# Patient Record
Sex: Female | Born: 1950 | Race: White | Hispanic: No | Marital: Single | Smoking: Current every day smoker
Health system: Southern US, Community
[De-identification: ages and names within clinical notes are randomized; demographics above are authoritative.]

## PROBLEM LIST (undated history)

## (undated) DIAGNOSIS — J449 Chronic obstructive pulmonary disease, unspecified: Secondary | ICD-10-CM

## (undated) DIAGNOSIS — E7800 Pure hypercholesterolemia, unspecified: Secondary | ICD-10-CM

## (undated) DIAGNOSIS — I10 Essential (primary) hypertension: Secondary | ICD-10-CM

## (undated) HISTORY — PX: CATARACT EXTRACTION: SUR2

## (undated) HISTORY — DX: Chronic obstructive pulmonary disease, unspecified: J44.9

---

## 2014-07-21 DIAGNOSIS — J069 Acute upper respiratory infection, unspecified: Secondary | ICD-10-CM | POA: Diagnosis not present

## 2014-07-21 DIAGNOSIS — E782 Mixed hyperlipidemia: Secondary | ICD-10-CM | POA: Diagnosis not present

## 2014-07-21 DIAGNOSIS — I10 Essential (primary) hypertension: Secondary | ICD-10-CM | POA: Diagnosis not present

## 2014-07-21 DIAGNOSIS — E780 Pure hypercholesterolemia: Secondary | ICD-10-CM | POA: Diagnosis not present

## 2014-07-25 DIAGNOSIS — J209 Acute bronchitis, unspecified: Secondary | ICD-10-CM | POA: Diagnosis not present

## 2014-07-25 DIAGNOSIS — J9801 Acute bronchospasm: Secondary | ICD-10-CM | POA: Diagnosis not present

## 2015-01-26 DIAGNOSIS — I10 Essential (primary) hypertension: Secondary | ICD-10-CM | POA: Diagnosis not present

## 2015-01-26 DIAGNOSIS — R5383 Other fatigue: Secondary | ICD-10-CM | POA: Diagnosis not present

## 2015-01-26 DIAGNOSIS — E782 Mixed hyperlipidemia: Secondary | ICD-10-CM | POA: Diagnosis not present

## 2015-03-08 DIAGNOSIS — R799 Abnormal finding of blood chemistry, unspecified: Secondary | ICD-10-CM | POA: Diagnosis not present

## 2015-09-06 DIAGNOSIS — E782 Mixed hyperlipidemia: Secondary | ICD-10-CM | POA: Diagnosis not present

## 2015-09-06 DIAGNOSIS — J060 Acute laryngopharyngitis: Secondary | ICD-10-CM | POA: Diagnosis not present

## 2015-09-06 DIAGNOSIS — J069 Acute upper respiratory infection, unspecified: Secondary | ICD-10-CM | POA: Diagnosis not present

## 2015-09-06 DIAGNOSIS — I10 Essential (primary) hypertension: Secondary | ICD-10-CM | POA: Diagnosis not present

## 2015-12-11 DIAGNOSIS — J209 Acute bronchitis, unspecified: Secondary | ICD-10-CM | POA: Diagnosis not present

## 2015-12-11 DIAGNOSIS — J9801 Acute bronchospasm: Secondary | ICD-10-CM | POA: Diagnosis not present

## 2016-03-12 DIAGNOSIS — J060 Acute laryngopharyngitis: Secondary | ICD-10-CM | POA: Diagnosis not present

## 2016-03-12 DIAGNOSIS — E782 Mixed hyperlipidemia: Secondary | ICD-10-CM | POA: Diagnosis not present

## 2016-03-12 DIAGNOSIS — I10 Essential (primary) hypertension: Secondary | ICD-10-CM | POA: Diagnosis not present

## 2016-03-12 DIAGNOSIS — J069 Acute upper respiratory infection, unspecified: Secondary | ICD-10-CM | POA: Diagnosis not present

## 2016-09-11 DIAGNOSIS — I10 Essential (primary) hypertension: Secondary | ICD-10-CM | POA: Diagnosis not present

## 2016-09-11 DIAGNOSIS — J4520 Mild intermittent asthma, uncomplicated: Secondary | ICD-10-CM | POA: Diagnosis not present

## 2016-09-11 DIAGNOSIS — E782 Mixed hyperlipidemia: Secondary | ICD-10-CM | POA: Diagnosis not present

## 2017-02-04 ENCOUNTER — Encounter (HOSPITAL_COMMUNITY): Payer: Self-pay

## 2017-02-04 ENCOUNTER — Emergency Department (HOSPITAL_COMMUNITY): Payer: Medicare Other

## 2017-02-04 DIAGNOSIS — E860 Dehydration: Secondary | ICD-10-CM | POA: Diagnosis not present

## 2017-02-04 DIAGNOSIS — F17210 Nicotine dependence, cigarettes, uncomplicated: Secondary | ICD-10-CM | POA: Diagnosis not present

## 2017-02-04 DIAGNOSIS — I10 Essential (primary) hypertension: Secondary | ICD-10-CM | POA: Diagnosis not present

## 2017-02-04 DIAGNOSIS — E876 Hypokalemia: Secondary | ICD-10-CM | POA: Insufficient documentation

## 2017-02-04 DIAGNOSIS — R791 Abnormal coagulation profile: Secondary | ICD-10-CM | POA: Insufficient documentation

## 2017-02-04 DIAGNOSIS — Z23 Encounter for immunization: Secondary | ICD-10-CM | POA: Insufficient documentation

## 2017-02-04 DIAGNOSIS — E7800 Pure hypercholesterolemia, unspecified: Secondary | ICD-10-CM | POA: Insufficient documentation

## 2017-02-04 DIAGNOSIS — I959 Hypotension, unspecified: Secondary | ICD-10-CM | POA: Diagnosis not present

## 2017-02-04 DIAGNOSIS — J9801 Acute bronchospasm: Secondary | ICD-10-CM | POA: Diagnosis not present

## 2017-02-04 DIAGNOSIS — R0602 Shortness of breath: Secondary | ICD-10-CM | POA: Diagnosis not present

## 2017-02-04 DIAGNOSIS — Z79899 Other long term (current) drug therapy: Secondary | ICD-10-CM | POA: Insufficient documentation

## 2017-02-04 DIAGNOSIS — J208 Acute bronchitis due to other specified organisms: Secondary | ICD-10-CM | POA: Diagnosis not present

## 2017-02-04 DIAGNOSIS — R000 Tachycardia, unspecified: Secondary | ICD-10-CM | POA: Diagnosis not present

## 2017-02-04 DIAGNOSIS — R197 Diarrhea, unspecified: Secondary | ICD-10-CM | POA: Insufficient documentation

## 2017-02-04 DIAGNOSIS — I9589 Other hypotension: Secondary | ICD-10-CM | POA: Insufficient documentation

## 2017-02-04 DIAGNOSIS — R05 Cough: Secondary | ICD-10-CM | POA: Diagnosis not present

## 2017-02-04 DIAGNOSIS — J441 Chronic obstructive pulmonary disease with (acute) exacerbation: Secondary | ICD-10-CM | POA: Diagnosis not present

## 2017-02-04 DIAGNOSIS — N179 Acute kidney failure, unspecified: Secondary | ICD-10-CM | POA: Insufficient documentation

## 2017-02-04 DIAGNOSIS — R5383 Other fatigue: Secondary | ICD-10-CM | POA: Diagnosis not present

## 2017-02-04 DIAGNOSIS — J069 Acute upper respiratory infection, unspecified: Secondary | ICD-10-CM | POA: Diagnosis not present

## 2017-02-04 LAB — CBC
HCT: 37.3 % (ref 36.0–46.0)
HEMOGLOBIN: 12.8 g/dL (ref 12.0–15.0)
MCH: 34.6 pg — AB (ref 26.0–34.0)
MCHC: 34.3 g/dL (ref 30.0–36.0)
MCV: 100.8 fL — AB (ref 78.0–100.0)
Platelets: 231 10*3/uL (ref 150–400)
RBC: 3.7 MIL/uL — AB (ref 3.87–5.11)
RDW: 12.4 % (ref 11.5–15.5)
WBC: 13 10*3/uL — ABNORMAL HIGH (ref 4.0–10.5)

## 2017-02-04 LAB — D-DIMER, QUANTITATIVE: D-Dimer, Quant: 3.14 ug/mL-FEU — ABNORMAL HIGH (ref 0.00–0.50)

## 2017-02-04 LAB — BASIC METABOLIC PANEL
ANION GAP: 12 (ref 5–15)
BUN: 70 mg/dL — AB (ref 6–20)
CALCIUM: 9.2 mg/dL (ref 8.9–10.3)
CO2: 14 mmol/L — ABNORMAL LOW (ref 22–32)
Chloride: 109 mmol/L (ref 101–111)
Creatinine, Ser: 3.74 mg/dL — ABNORMAL HIGH (ref 0.44–1.00)
GFR calc Af Amer: 13 mL/min — ABNORMAL LOW (ref >60)
GFR, EST NON AFRICAN AMERICAN: 11 mL/min — AB (ref >60)
GLUCOSE: 109 mg/dL — AB (ref 65–99)
Potassium: 4 mmol/L (ref 3.5–5.1)
Sodium: 135 mmol/L (ref 135–145)

## 2017-02-04 LAB — I-STAT TROPONIN, ED: Troponin i, poc: 0.01 ng/mL (ref 0.00–0.08)

## 2017-02-04 NOTE — ED Triage Notes (Signed)
Pt sent here from [facility], [address] Gerre PebblesSally Levine.  Pt seen in office today for shortness of breath, fatigue.  Pt refused to go to ER at that time, MD did EKG- results normal, and lab work.  MD received call from lab that BNP was normal, D Dimer +, BUN 5, creatinine >3.  MD requesting VQ Scan to r/o PE.

## 2017-02-05 ENCOUNTER — Observation Stay (HOSPITAL_COMMUNITY)
Admission: EM | Admit: 2017-02-05 | Discharge: 2017-02-06 | Disposition: A | Discharge status: Home / Self Care | Payer: Medicare Other | Attending: Internal Medicine | Admitting: Internal Medicine

## 2017-02-05 ENCOUNTER — Emergency Department (HOSPITAL_COMMUNITY): Payer: Medicare Other

## 2017-02-05 DIAGNOSIS — Z720 Tobacco use: Secondary | ICD-10-CM

## 2017-02-05 DIAGNOSIS — J441 Chronic obstructive pulmonary disease with (acute) exacerbation: Secondary | ICD-10-CM | POA: Diagnosis not present

## 2017-02-05 DIAGNOSIS — R6510 Systemic inflammatory response syndrome (SIRS) of non-infectious origin without acute organ dysfunction: Secondary | ICD-10-CM | POA: Diagnosis present

## 2017-02-05 DIAGNOSIS — I959 Hypotension, unspecified: Secondary | ICD-10-CM

## 2017-02-05 DIAGNOSIS — R05 Cough: Secondary | ICD-10-CM

## 2017-02-05 DIAGNOSIS — R0602 Shortness of breath: Secondary | ICD-10-CM | POA: Diagnosis not present

## 2017-02-05 DIAGNOSIS — R197 Diarrhea, unspecified: Secondary | ICD-10-CM | POA: Diagnosis not present

## 2017-02-05 DIAGNOSIS — N179 Acute kidney failure, unspecified: Secondary | ICD-10-CM | POA: Diagnosis present

## 2017-02-05 DIAGNOSIS — A419 Sepsis, unspecified organism: Secondary | ICD-10-CM

## 2017-02-05 DIAGNOSIS — E860 Dehydration: Secondary | ICD-10-CM | POA: Diagnosis not present

## 2017-02-05 DIAGNOSIS — R000 Tachycardia, unspecified: Secondary | ICD-10-CM

## 2017-02-05 DIAGNOSIS — R059 Cough, unspecified: Secondary | ICD-10-CM

## 2017-02-05 HISTORY — DX: Essential (primary) hypertension: I10

## 2017-02-05 HISTORY — DX: Pure hypercholesterolemia, unspecified: E78.00

## 2017-02-05 LAB — URINALYSIS, ROUTINE W REFLEX MICROSCOPIC
Bilirubin Urine: NEGATIVE
Glucose, UA: NEGATIVE mg/dL
Hgb urine dipstick: NEGATIVE
KETONES UR: NEGATIVE mg/dL
Nitrite: NEGATIVE
PROTEIN: NEGATIVE mg/dL
Specific Gravity, Urine: 1.012 (ref 1.005–1.030)
pH: 5 (ref 5.0–8.0)

## 2017-02-05 LAB — I-STAT CG4 LACTIC ACID, ED: LACTIC ACID, VENOUS: 1.02 mmol/L (ref 0.5–1.9)

## 2017-02-05 LAB — HEPATIC FUNCTION PANEL
ALBUMIN: 3.6 g/dL (ref 3.5–5.0)
ALT: 22 U/L (ref 14–54)
AST: 25 U/L (ref 15–41)
Alkaline Phosphatase: 68 U/L (ref 38–126)
BILIRUBIN INDIRECT: 0.1 mg/dL — AB (ref 0.3–0.9)
Bilirubin, Direct: 0.2 mg/dL (ref 0.1–0.5)
TOTAL PROTEIN: 7.3 g/dL (ref 6.5–8.1)
Total Bilirubin: 0.3 mg/dL (ref 0.3–1.2)

## 2017-02-05 LAB — MRSA PCR SCREENING: MRSA BY PCR: NEGATIVE

## 2017-02-05 LAB — CREATININE, URINE, RANDOM: Creatinine, Urine: 153.44 mg/dL

## 2017-02-05 LAB — PROCALCITONIN: PROCALCITONIN: 0.14 ng/mL

## 2017-02-05 LAB — STREP PNEUMONIAE URINARY ANTIGEN: STREP PNEUMO URINARY ANTIGEN: NEGATIVE

## 2017-02-05 LAB — SODIUM, URINE, RANDOM: Sodium, Ur: 34 mmol/L

## 2017-02-05 LAB — HIV ANTIBODY (ROUTINE TESTING W REFLEX): HIV Screen 4th Generation wRfx: NONREACTIVE

## 2017-02-05 LAB — CORTISOL: CORTISOL PLASMA: 12.4 ug/dL

## 2017-02-05 LAB — TSH: TSH: 1.969 u[IU]/mL (ref 0.350–4.500)

## 2017-02-05 MED ORDER — DEXTROSE 5 % IV SOLN
500.0000 mg | Freq: Once | INTRAVENOUS | Status: AC
  Administered 2017-02-05: 500 mg via INTRAVENOUS
  Filled 2017-02-05: qty 500

## 2017-02-05 MED ORDER — IPRATROPIUM-ALBUTEROL 0.5-2.5 (3) MG/3ML IN SOLN
3.0000 mL | RESPIRATORY_TRACT | Status: DC | PRN
  Administered 2017-02-05: 3 mL via RESPIRATORY_TRACT
  Filled 2017-02-05: qty 3

## 2017-02-05 MED ORDER — ACETAMINOPHEN 650 MG RE SUPP: 650.0000 mg | Freq: Four times a day (QID) | RECTAL | Status: DC | PRN

## 2017-02-05 MED ORDER — TECHNETIUM TC 99M DIETHYLENETRIAME-PENTAACETIC ACID
30.0000 | Freq: Once | INTRAVENOUS | Status: AC | PRN
  Administered 2017-02-05: 30 via INTRAVENOUS

## 2017-02-05 MED ORDER — GUAIFENESIN ER 600 MG PO TB12
600.0000 mg | ORAL_TABLET | Freq: Two times a day (BID) | ORAL | Status: DC
  Administered 2017-02-05 – 2017-02-06 (×3): 600 mg via ORAL
  Filled 2017-02-05 (×3): qty 1

## 2017-02-05 MED ORDER — LACTATED RINGERS IV SOLN
INTRAVENOUS | Status: DC
  Administered 2017-02-05 – 2017-02-06 (×2): via INTRAVENOUS

## 2017-02-05 MED ORDER — IPRATROPIUM-ALBUTEROL 0.5-2.5 (3) MG/3ML IN SOLN
3.0000 mL | Freq: Four times a day (QID) | RESPIRATORY_TRACT | Status: DC
  Administered 2017-02-05 – 2017-02-06 (×4): 3 mL via RESPIRATORY_TRACT
  Filled 2017-02-05 (×3): qty 3

## 2017-02-05 MED ORDER — SODIUM CHLORIDE 0.9 % IV BOLUS (SEPSIS)
500.0000 mL | Freq: Once | INTRAVENOUS | Status: AC
  Administered 2017-02-05: 500 mL via INTRAVENOUS

## 2017-02-05 MED ORDER — PREDNISONE 50 MG PO TABS
60.0000 mg | ORAL_TABLET | Freq: Every day | ORAL | Status: DC
  Administered 2017-02-05 – 2017-02-06 (×2): 60 mg via ORAL
  Filled 2017-02-05: qty 1
  Filled 2017-02-05: qty 3

## 2017-02-05 MED ORDER — AZITHROMYCIN 500 MG IV SOLR
500.0000 mg | INTRAVENOUS | Status: DC
  Filled 2017-02-05: qty 500

## 2017-02-05 MED ORDER — HEPARIN SODIUM (PORCINE) 5000 UNIT/ML IJ SOLN
5000.0000 [IU] | Freq: Three times a day (TID) | INTRAMUSCULAR | Status: DC
  Administered 2017-02-05 – 2017-02-06 (×2): 5000 [IU] via SUBCUTANEOUS
  Filled 2017-02-05 (×2): qty 1

## 2017-02-05 MED ORDER — SODIUM CHLORIDE 0.9 % IV SOLN: INTRAVENOUS | Status: DC

## 2017-02-05 MED ORDER — DEXTROSE 5 % IV SOLN
2.0000 g | INTRAVENOUS | Status: DC
  Filled 2017-02-05: qty 2

## 2017-02-05 MED ORDER — HEPARIN BOLUS VIA INFUSION
4000.0000 [IU] | Freq: Once | INTRAVENOUS | Status: DC
  Administered 2017-02-05: 4000 [IU] via INTRAVENOUS
  Filled 2017-02-05: qty 4000

## 2017-02-05 MED ORDER — ONDANSETRON HCL 4 MG/2ML IJ SOLN: 4.0000 mg | Freq: Four times a day (QID) | INTRAMUSCULAR | Status: DC | PRN

## 2017-02-05 MED ORDER — DEXTROSE 5 % IV SOLN: 1.0000 g | Freq: Once | INTRAVENOUS | Status: DC

## 2017-02-05 MED ORDER — TECHNETIUM TO 99M ALBUMIN AGGREGATED
4.0000 | Freq: Once | INTRAVENOUS | Status: AC | PRN
  Administered 2017-02-05: 4 via INTRAVENOUS

## 2017-02-05 MED ORDER — ACETAMINOPHEN 325 MG PO TABS: 650.0000 mg | ORAL_TABLET | Freq: Four times a day (QID) | ORAL | Status: DC | PRN

## 2017-02-05 MED ORDER — ONDANSETRON HCL 4 MG PO TABS: 4.0000 mg | ORAL_TABLET | Freq: Four times a day (QID) | ORAL | Status: DC | PRN

## 2017-02-05 MED ORDER — DEXTROSE 5 % IV SOLN
2.0000 g | Freq: Once | INTRAVENOUS | Status: AC
  Administered 2017-02-05: 2 g via INTRAVENOUS
  Filled 2017-02-05: qty 2

## 2017-02-05 MED ORDER — PNEUMOCOCCAL VAC POLYVALENT 25 MCG/0.5ML IJ INJ
0.5000 mL | INJECTION | INTRAMUSCULAR | Status: AC
  Administered 2017-02-06: 0.5 mL via INTRAMUSCULAR
  Filled 2017-02-05: qty 0.5

## 2017-02-05 MED ORDER — SODIUM CHLORIDE 0.9 % IV BOLUS (SEPSIS)
1000.0000 mL | Freq: Once | INTRAVENOUS | Status: AC
  Administered 2017-02-05: 1000 mL via INTRAVENOUS

## 2017-02-05 MED ORDER — PANTOPRAZOLE SODIUM 40 MG PO TBEC
40.0000 mg | DELAYED_RELEASE_TABLET | Freq: Every day | ORAL | Status: DC
  Administered 2017-02-05 – 2017-02-06 (×2): 40 mg via ORAL
  Filled 2017-02-05 (×2): qty 1

## 2017-02-05 MED ORDER — HEPARIN (PORCINE) IN NACL 100-0.45 UNIT/ML-% IJ SOLN
1300.0000 [IU]/h | INTRAMUSCULAR | Status: DC
  Administered 2017-02-05: 1300 [IU]/h via INTRAVENOUS
  Filled 2017-02-05: qty 250

## 2017-02-05 NOTE — ED Notes (Signed)
Pt transferred to Nuclear medicine.

## 2017-02-05 NOTE — H&P (Signed)
History and Physical    Courtney Levine ZOX:[id number] DOB: 1950/09/07 DOA: 02/05/2017  Referring MD/NP/PA: Dierdre ForthHannah Muthersbaugh, PA-C PCP: Patient, No Pcp Per  Patient coming from: home  Chief Complaint: SOB  HPI: Courtney PetrinCathy I Levine is a 66 y.o. female with medical history significant of HTN, HLD, and tobacco abuse; who presents with complaints of shortness of breath with productive cough over the last 5 days. Patient reports coughing up losts clear sputum. She was seen by her PCP yesterday morning and thought to have a upper respiratory infection for which she was given an antibiotic  and steroid in the office. The PCP also obtain lab work that resulted later that afternoon and the patient was instructed to come to the emergency department for further evaluation. Upon further desiccation and appears that the patient had been feeling very fatigued. Notes associated symptoms of poor oral intake, several days of diarrhea, wheezing, and lightheadedness. Denies having any fever, chills, abdominal pain, inability to void, chest pain, loss of consciousness, vomiting, recent travel/prolonged immobilization, use of estrogens, or history of bloodclots/bleeding disorder.  ED Course: On admission to the emergency department patient was seen to be afebrile pulse 93-118, respirations 16-34, blood pressure as low as 75/50, and O2 saturation maintained on room air. Blood pressures improved after 500 mL of normal saline IV fluids. Labs revealed WBC 13, CO2 14, BUN 70, and creatinine 3.74, d-dimer 3.14. A VQ scan was obtained showing a small single matched defect in the right lung field no low probability for pulmonary embolus. Patient was ordered to be started on heparin. TRH called to admit. Asked ED providerto order a lactic acid, urinalysis, with in/out cath to further evaluate acute renal failure and possible infection.  Review of Systems  Constitutional: Positive for malaise/fatigue. Negative for chills and fever.    Respiratory: Positive for cough, sputum production, shortness of breath and wheezing.   Cardiovascular: Negative for chest pain and leg swelling.  Gastrointestinal: Positive for diarrhea.  Genitourinary: Negative for frequency and urgency.  Musculoskeletal: Positive for joint pain. Negative for falls.  Skin: Negative for itching and rash.  Neurological: Positive for dizziness and weakness. Negative for loss of consciousness.    Past Medical History:  Diagnosis Date  . Hypercholesteremia   . Hypertension     History reviewed. No pertinent surgical history.   reports that she has been smoking Cigarettes.  She has been smoking about 0.75 packs per day. She has never used smokeless tobacco. She reports that she does not drink alcohol or use drugs.  No Known Allergies  History reviewed. No pertinent family history.  Prior to Admission medications   Medication Sig Start Date End Date Taking? Authorizing Provider  albuterol (PROVENTIL HFA;VENTOLIN HFA) 108 (90 Base) MCG/ACT inhaler Inhale 2 puffs into the lungs every 4 (four) hours as needed for wheezing or shortness of breath.   Yes [provider]  levofloxacin (LEVAQUIN) 500 MG tablet Take 500 mg by mouth daily. For ten days   Yes [provider]  lisinopril (PRINIVIL,ZESTRIL) 20 MG tablet Take 20 mg by mouth daily.   Yes [provider]  lovastatin (MEVACOR) 20 MG tablet Take 20 mg by mouth daily.   Yes [provider]  predniSONE (DELTASONE) 20 MG tablet Take 20-60 mg by mouth See admin instructions. TAPER 60mg  daily x3d 40mg  daily x3d 20mg  daily x3d    [provider]    Physical Exam:  Constitutional:Elderly female who appears to be acutely Vitals:   02/05/17  0245 02/05/17 0300 02/05/17 0415 02/05/17 0430  BP: (!) 81/58 96/61 (!) 88/69 (!) 106/50  Pulse: (!) 104 (!) 101  (!) 108  Resp: (!) 23 (!) 26  (!) 24  Temp:      TempSrc:      SpO2: 100% 98%  98%  Weight:      Height:        Eyes: PERRL, lids and conjunctivae normal ENMT: Mucous membranes are dry. Posterior pharynx clear of any exudate or lesions.Normal dentition.  Neck: normal, supple, no masses, no thyromegaly Respiratory: clear to auscultation bilaterally, no wheezing, no crackles. Normal respiratory effort. No accessory muscle use.  Cardiovascular: Regular rate and rhythm, no murmurs / rubs / gallops. No extremity edema. 2+ pedal pulses. No carotid bruits.  Abdomen: no tenderness, no masses palpated. No hepatosplenomegaly. Bowel sounds positive.  Musculoskeletal: no clubbing / cyanosis. No joint deformity upper and lower extremities. Good ROM, no contractures. Normal muscle tone.  Skin: Poor skin turgor. Neurologic: CN 2-12 grossly intact. Sensation intact, DTR normal. Strength 5/5 in all 4.  Psychiatric: Normal judgment and insight. Alert and oriented x 3. Normal mood.     Labs on Admission: I have personally reviewed following labs and imaging studies  CBC:  Recent Labs Lab 02/04/17 2044  WBC 13.0*  HGB 12.8  HCT 37.3  MCV 100.8*  PLT 231   Basic Metabolic Panel:  Recent Labs Lab 02/04/17 2044  NA 135  K 4.0  CL 109  CO2 14*  GLUCOSE 109*  BUN 70*  CREATININE 3.74*  CALCIUM 9.2   GFR: Estimated Creatinine Clearance: 17.3 mL/min (A) (by C-G formula based on SCr of 3.74 mg/dL (H)). Liver Function Tests: No results for input(s): AST, ALT, ALKPHOS, BILITOT, PROT, ALBUMIN in the last 168 hours. No results for input(s): LIPASE, AMYLASE in the last 168 hours. No results for input(s): AMMONIA in the last 168 hours. Coagulation Profile: No results for input(s): INR, PROTIME in the last 168 hours. Cardiac Enzymes: No results for input(s): CKTOTAL, CKMB, CKMBINDEX, TROPONINI in the last 168 hours. BNP (last 3 results) No results for input(s): PROBNP in the last 8760 hours. HbA1C: No results for input(s): HGBA1C in the last 72 hours. CBG: No results for input(s): GLUCAP in the  last 168 hours. Lipid Profile: No results for input(s): CHOL, HDL, LDLCALC, TRIG, CHOLHDL, LDLDIRECT in the last 72 hours. Thyroid Function Tests: No results for input(s): TSH, T4TOTAL, FREET4, T3FREE, THYROIDAB in the last 72 hours. Anemia Panel: No results for input(s): VITAMINB12, FOLATE, FERRITIN, TIBC, IRON, RETICCTPCT in the last 72 hours. Urine analysis: No results found for: COLORURINE, APPEARANCEUR, LABSPEC, PHURINE, GLUCOSEU, HGBUR, BILIRUBINUR, KETONESUR, PROTEINUR, UROBILINOGEN, NITRITE, LEUKOCYTESUR Sepsis Labs: No results found for this or any previous visit (from the past 240 hour(s)).   Radiological Exams on Admission: Dg Chest 2 View  Result Date: 02/04/2017 CLINICAL DATA:  Shortness of breath and cough for 1 week EXAM: CHEST  2 VIEW COMPARISON:  None. FINDINGS: The heart size and mediastinal contours are within normal limits. Both lungs are clear. The visualized skeletal structures are unremarkable. IMPRESSION: No active cardiopulmonary disease. Electronically Signed   By: Alcide Clever M.D.   On: 02/04/2017 21:21   Nm Pulmonary Vent And Perf (v/q Scan)  Result Date: 02/05/2017 CLINICAL DATA:  PE suspected, intermediate prob, positive D-dimer. Cough and shortness of breath. EXAM: NUCLEAR MEDICINE VENTILATION - PERFUSION LUNG SCAN TECHNIQUE: Ventilation images were obtained in multiple projections using inhaled aerosol Tc-60m DTPA. Perfusion  images were obtained in multiple projections after intravenous injection of Tc-63m MAA. RADIOPHARMACEUTICALS:  32.0 mCi Technetium-50m DTPA aerosol inhalation and 4.2 mCi Technetium-74m MAA IV COMPARISON:  Chest radiograph earlier this day. FINDINGS: Ventilation: Diffusely patchy ventilation imaging, particularly in the right mid lung. There is central deposition of radiotracer. Perfusion: Profusion imaging more homogeneous than ventilation. Small matched defect in the periphery of the right mid lung. No ventilation perfusion mismatches.  IMPRESSION: 1. Low probability for pulmonary embolus with a single matched ventilation perfusion defect in the right midlung, likely right lower lobe. 2. Diffusely heterogeneous ventilation imaging with central deposition of radiotracer in the trachea, suggesting airways disease/COPD. Electronically Signed   By: Rubye Oaks M.D.   On: 02/05/2017 04:44    EKG: Independently reviewed. Sinus tachycardia with low voltage  Assessment/Plan SIRS: Acute. Patient presents with tachycardiac, tachypneic, and hypotensive with blood pressure 75/50. No lactic acid was initially obtained, but WBC was elevated at 13. Patient also recently given steroids of note. Question possibility of underlying infection as cause of patient's symptoms. - Admit to stepdown bed - Follow-up lactic acid - Add on Procalcitionin - check blood cultures   COPD, Exacerbation with possible CAP vs. Viral URI vs Less likely right lobe pulmonary embolus. Patient history more consistent with possible COPD exacerbation. Patient without increased well's criteria although elevated d-dimer of 3.14. VQ scan was low probability for signs of a PE. Patient had been ordered to be started on heparin drip. - Discontinued Heparin gtt per pharmacy - Prednisone - Empiric antibiotics of ceftriaxone and azithromycin - Duonebs QID and prn SOB/Wheezing - Mucinex  Transient Hypotension : Resolving. Blood pressures initially as low as 75/50 with improvement with IV fluids. -  Bolus additional of NS IV fluids, then place on a rate of 123ml/hr  Acute renal failure 2/2 dehydration: Patient reports no previous history of kidney issues in the past. She reports having diarrhea and poor appetite over the last 5 days. Creatinine 3.14 with BUN 72 suggest prerenal cause of symptoms. - Follow-up urinalysis - check FeNa - Strict I and O's - Continue IV fluids as seen above  Diarrhea: Patient reports resolution of diarrhea. - continue to  monitor  Tobacco abuse: Patient declines ED of nicotine patch. - Counseled on the need for cessation of tobacco DVT prophylaxis: Heparin Code Status: Full Family Communication: Discussed plan of care with patient and family present at bedside  Disposition Plan: Likely discharge home in 2-3 days was medically stable Consults called: None Admission status: Inpatient  Clydie Braun MD [facility] Pager [phone]   If 7PM-7AM, please contact night-coverage [email or web address] Password [email or web address]  02/05/2017, 5:11 AM

## 2017-02-05 NOTE — Progress Notes (Signed)
ANTICOAGULATION CONSULT NOTE - Initial Consult  Pharmacy Consult for heparin Indication: possible pulmonary embolus  No Known Allergies  Patient Measurements: Height: 5\' 7"  (170.2 cm) Weight: 215 lb (97.5 kg) IBW/kg (Calculated) : 61.6 Heparin Dosing Weight: 85kg  Vital Signs: Temp: 97.6 F (36.4 C) (09/11 2020) Temp Source: Oral (09/11 2020) BP: 75/50 (09/12 0006) Pulse Rate: 104 (09/12 0006)  Labs:  Recent Labs  02/04/17 2044  HGB 12.8  HCT 37.3  PLT 231  CREATININE 3.74*    Estimated Creatinine Clearance: 17.3 mL/min (A) (by C-G formula based on SCr of 3.74 mg/dL (H)).   Medical History: Past Medical History:  Diagnosis Date  . Hypercholesteremia   . Hypertension     Assessment: 66yo female presented to PCP for SOB, office called pt w/ abnormal D-dimer and sent to ED, awaiting VQ scan for possible PE, to begin heparin.  Goal of Therapy:  Heparin level 0.3-0.7 units/ml Monitor platelets by anticoagulation protocol: Yes   Plan:  Will give heparin 4000 units IV bolus x1 followed by gtt at 1300 units/hr and monitor heparin levels and CBC.  Vernard GamblesVeronda Angelamarie Levine, PharmD, BCPS  02/05/2017,12:55 AM

## 2017-02-05 NOTE — ED Provider Notes (Signed)
Patient presented to the ER with shortness of breath. Has had a cough for a week. Shortness of breath worsened and she saw her PCP who did a D-Dimer which was elevated, sent to ER.  Face to face Exam: HEENT - PERRLA Lungs - tachypnea, CTAB Heart - tachycardia, no M/R/G Abd - S/NT/ND Neuro - alert, oriented x3  Plan: Patient to Take and tachycardic, hypotensive. She has a markedly elevated d-dimer and therefore there is concern for PE. She was, however, found to be in acute renal failure, hypotension and tachycardia might be secondary to dehydration and renal failure. Because of the elevated creatinine, she cannot have a CT angiography to rule out PE. She will need to be scheduled for VQ scan. Blood pressure is improving with IV fluids. Continue to hydrate. As her vital signs are improving with hydration, will hold off on heparinization at this time.   Gilda CreasePollina, Sydnee Lamour J, MD 02/05/17 [phone]

## 2017-02-05 NOTE — ED Notes (Signed)
Dr. Judd Lienelo notified on pt.'s elevated D- dimer result.

## 2017-02-05 NOTE — Progress Notes (Signed)
Pt IV was leaking so a new IV was started with no complications. Pt also MRSA positive. Contact precautions initiated. Will continue to monitor.

## 2017-02-05 NOTE — Progress Notes (Signed)
 @        PROGRESS NOTE                                                                                                                                                                                                             Patient Demographics:    Courtney Levine, is a 66 y.o. female, DOB - 1950/09/24, ZOX:[id number]  Admit date - 02/05/2017   Admitting Physician Clydie Braun, MD  Outpatient Primary MD for the patient is Patient, No Pcp Per  LOS - 0  Chief Complaint  Patient presents with  . Shortness of Breath  . Cough       Brief Narrative Courtney Levine is a 66 y.o. female with medical history significant of HTN, HLD, and tobacco abuse; who presents with complaints of shortness of breath with productive cough over the last 5 days. Patient reports coughing up losts clear sputum. She was seen by her PCP yesterday morning and thought to have a upper respiratory infection for which she was given an antibiotic  and steroid in the office. The PCP also obtain lab work that resulted later that afternoon and the patient was instructed to come to the emergency department for further evaluation, she was found to be profoundly dehydrated, hypotensive with acute renal failure.   Subjective:    Courtney Levine today has, No headache, No chest pain, No abdominal pain - No Nausea, No new weakness tingling or numbness, No Cough - SOB.    Assessment  & Plan :     1.SIRs due to Viral URI causing cough, diarrhea with profound dehydration and ARF. No clear signs of pneumonia, antibiotics given upon admission will be continued for 24 hours until cultures are negative, aggressive IV fluids, diarrhea seems to have resolved, monitor BMP and blood pressure closely. Check TSH and random cortisol.  2. Smoking. Counseled to quit.  3. Elevated d-dimer. Due to acute illness, VQ scan low probability. No clinical suspicion of PE.  4. COPD. At baseline no wheezing, taper off steroids quickly. Supportive  care for now.    Diet : Diet Heart Room service appropriate? Yes; Fluid consistency: Thin    Family Communication  :  sister  Code Status :  Full  Disposition Plan  :  TBD  Consults  :  None  Procedures  :   VQ - low probability  DVT Prophylaxis  :    Heparin    Lab Results  Component Value Date   PLT 231 02/04/2017  Inpatient Medications  Scheduled Meds: . guaiFENesin  600 mg Oral BID  . heparin subcutaneous  5,000 Units Subcutaneous Q8H  . ipratropium-albuterol  3 mL Nebulization QID  . pantoprazole  40 mg Oral Daily  . predniSONE  60 mg Oral Q breakfast   Continuous Infusions: . [START ON 02/06/2017] azithromycin    . [START ON 02/06/2017] cefTRIAXone (ROCEPHIN)  IV    . lactated ringers     PRN Meds:.acetaminophen **OR** acetaminophen, ipratropium-albuterol, ondansetron **OR** ondansetron (ZOFRAN) IV  Antibiotics  :    Anti-infectives    Start     Dose/Rate Route Frequency Ordered Stop   02/06/17 0800  azithromycin (ZITHROMAX) 500 mg in dextrose 5 % 250 mL IVPB     500 mg 250 mL/hr over 60 Minutes Intravenous Every 24 hours 02/05/17 0613     02/06/17 0800  cefTRIAXone (ROCEPHIN) 2 g in dextrose 5 % 50 mL IVPB     2 g 100 mL/hr over 30 Minutes Intravenous Every 24 hours 02/05/17 0621     02/05/17 0630  cefTRIAXone (ROCEPHIN) 2 g in dextrose 5 % 50 mL IVPB     2 g 100 mL/hr over 30 Minutes Intravenous  Once 02/05/17 0621 02/05/17 0755   02/05/17 0630  azithromycin (ZITHROMAX) 500 mg in dextrose 5 % 250 mL IVPB     500 mg 250 mL/hr over 60 Minutes Intravenous  Once 02/05/17 0622 02/05/17 0935   02/05/17 0615  cefTRIAXone (ROCEPHIN) 1 g in dextrose 5 % 50 mL IVPB  Status:  Discontinued     1 g 100 mL/hr over 30 Minutes Intravenous  Once 02/05/17 0613 02/05/17 0621         Objective:   Vitals:   02/05/17 0815 02/05/17 0840 02/05/17 0921 02/05/17 0930  BP: (!) 120/91 109/84 95/64 98/66   Pulse: (!) 105 (!) 105 98 98  Resp: (!) 29 (!) 33 17 (!) 26   Temp:      TempSrc:      SpO2: 99% 97% 98% 96%  Weight:      Height:        Wt Readings from Last 3 Encounters:  02/04/17 97.5 kg (215 lb)     Intake/Output Summary (Last 24 hours) at 02/05/17 1031 Last data filed at 02/05/17 0546  Gross per 24 hour  Intake             1500 ml  Output                0 ml  Net             1500 ml     Physical Exam  Awake Alert, Oriented X 3, No new F.N deficits, Normal affect [place].AT,PERRAL Supple Neck,No JVD, No cervical lymphadenopathy appriciated.  Symmetrical Chest wall movement, Good air movement bilaterally, CTAB RRR,No Gallops,Rubs or new Murmurs, No Parasternal Heave +ve B.Sounds, Abd Soft, No tenderness, No organomegaly appriciated, No rebound - guarding or rigidity. No Cyanosis, Clubbing or edema, No new Rash or bruise      Data Review:    CBC  Recent Labs Lab 02/04/17 2044  WBC 13.0*  HGB 12.8  HCT 37.3  PLT 231  MCV 100.8*  MCH 34.6*  MCHC 34.3  RDW 12.4    Chemistries   Recent Labs Lab 02/04/17 2044 02/05/17 0639  NA 135  --   K 4.0  --   CL 109  --   CO2 14*  --   GLUCOSE 109*  --  BUN 70*  --   CREATININE 3.74*  --   CALCIUM 9.2  --   AST  --  25  ALT  --  22  ALKPHOS  --  68  BILITOT  --  0.3   ------------------------------------------------------------------------------------------------------------------ No results for input(s): CHOL, HDL, LDLCALC, TRIG, CHOLHDL, LDLDIRECT in the last 72 hours.  No results found for: HGBA1C ------------------------------------------------------------------------------------------------------------------  Recent Labs  02/04/17 2044  TSH 1.969   ------------------------------------------------------------------------------------------------------------------ No results for input(s): VITAMINB12, FOLATE, FERRITIN, TIBC, IRON, RETICCTPCT in the last 72 hours.  Coagulation profile No results for input(s): INR, PROTIME in the last 168 hours.   Recent  Labs  02/04/17 2044  DDIMER 3.14*    Cardiac Enzymes No results for input(s): CKMB, TROPONINI, MYOGLOBIN in the last 168 hours.  Invalid input(s): CK ------------------------------------------------------------------------------------------------------------------ No results found for: BNP  Micro Results No results found for this or any previous visit (from the past 240 hour(s)).  Radiology Reports Dg Chest 2 View  Result Date: 02/04/2017 CLINICAL DATA:  Shortness of breath and cough for 1 week EXAM: CHEST  2 VIEW COMPARISON:  None. FINDINGS: The heart size and mediastinal contours are within normal limits. Both lungs are clear. The visualized skeletal structures are unremarkable. IMPRESSION: No active cardiopulmonary disease. Electronically Signed   By: Alcide CleverMark  Lukens M.D.   On: 02/04/2017 21:21   Nm Pulmonary Vent And Perf (v/q Scan)  Result Date: 02/05/2017 CLINICAL DATA:  PE suspected, intermediate prob, positive D-dimer. Cough and shortness of breath. EXAM: NUCLEAR MEDICINE VENTILATION - PERFUSION LUNG SCAN TECHNIQUE: Ventilation images were obtained in multiple projections using inhaled aerosol Tc-3636m DTPA. Perfusion images were obtained in multiple projections after intravenous injection of Tc-3436m MAA. RADIOPHARMACEUTICALS:  32.0 mCi Technetium-1436m DTPA aerosol inhalation and 4.2 mCi Technetium-2836m MAA IV COMPARISON:  Chest radiograph earlier this day. FINDINGS: Ventilation: Diffusely patchy ventilation imaging, particularly in the right mid lung. There is central deposition of radiotracer. Perfusion: Profusion imaging more homogeneous than ventilation. Small matched defect in the periphery of the right mid lung. No ventilation perfusion mismatches. IMPRESSION: 1. Low probability for pulmonary embolus with a single matched ventilation perfusion defect in the right midlung, likely right lower lobe. 2. Diffusely heterogeneous ventilation imaging with central deposition of radiotracer in the  trachea, suggesting airways disease/COPD. Electronically Signed   By: Rubye OaksMelanie  Ehinger M.D.   On: 02/05/2017 04:44    Time Spent in minutes  30   Susa RaringPrashant  M.D on 02/05/2017 at 10:31 AM  Between 7am to 7pm - Pager - [phone] ( page via [email or web address], text pages only, please mention full 10 digit call back number). After 7pm go to [email or web address] - password [email or web address]

## 2017-02-05 NOTE — ED Provider Notes (Signed)
[facility]-EMERGENCY DEPT Provider Note   CSN: [id number] Arrival date & time: 02/04/17  2013     History   Chief Complaint Chief Complaint  Patient presents with  . Shortness of Breath  . Cough    HPI Courtney Levine is a 66 y.o. female with a hx of HTN, high cholesterol, COPD (current smoker) presents to the Emergency Department complaining of gradual, persistent, progressively worsening SOB, fatigue and cough onset 6 days ago.  Pt reports she thought she had a URI and saw her PCP this morning.  She was given an antibiotic injection and prednisone in the office.  Blood work was drawn and pt was d/c with prednisone and antibiotics with presumed URI.  Pt reports her PCP called this afternoon reporting abnormal labs and the need to present to the ED.  Pt reports she has never had a blood clot, does not take estrogen, denies periods of immobilization, fracture or surgery. Patient reports no problems with her kidneys in the past. She reports severe fatigue over the last several days but denies fevers or chills, chest pain, abdominal pain, nausea, vomiting, diarrhea, weakness, dizziness, syncope.  Patient denies difficulty voiding, dysuria, hematuria, urinary urgency, urinary frequency.  The history is provided by medical records and the patient. No language interpreter was used.  Shortness of Breath  Associated symptoms include cough. Pertinent negatives include no fever, no headaches, no wheezing, no chest pain, no vomiting, no abdominal pain, no rash and no leg swelling.  Cough  Associated symptoms include shortness of breath. Pertinent negatives include no chest pain, no headaches and no wheezing.    Past Medical History:  Diagnosis Date  . Hypercholesteremia   . Hypertension     There are no active problems to display for this patient.   History reviewed. No pertinent surgical history.  OB History    No data available       Home Medications    Prior to Admission medications     Medication Sig Start Date End Date Taking? Authorizing Provider  albuterol (PROVENTIL HFA;VENTOLIN HFA) 108 (90 Base) MCG/ACT inhaler Inhale 2 puffs into the lungs every 4 (four) hours as needed for wheezing or shortness of breath.   Yes [provider]  levofloxacin (LEVAQUIN) 500 MG tablet Take 500 mg by mouth daily. For ten days   Yes [provider]  lisinopril (PRINIVIL,ZESTRIL) 20 MG tablet Take 20 mg by mouth daily.   Yes [provider]  lovastatin (MEVACOR) 20 MG tablet Take 20 mg by mouth daily.   Yes [provider]  predniSONE (DELTASONE) 20 MG tablet Take 20-60 mg by mouth See admin instructions. TAPER  daily x3d  daily x3d  daily x3d    [provider]    Family History History reviewed. No pertinent family history.  Social History Social History  Substance Use Topics  . Smoking status: Current Every Day Smoker    Packs/day: 0.75    Types: Cigarettes  . Smokeless tobacco: Never Used  . Alcohol use No     Allergies   Patient has no known allergies.   Review of Systems Review of Systems  Constitutional: Negative for appetite change, diaphoresis, fatigue, fever and unexpected weight change.  HENT: Negative for mouth sores.   Eyes: Negative for visual disturbance.  Respiratory: Positive for cough and shortness of breath. Negative for chest tightness and wheezing.   Cardiovascular: Negative for chest pain, palpitations and leg swelling.  Gastrointestinal: Negative for abdominal pain, constipation, diarrhea,  nausea and vomiting.  Endocrine: Negative for polydipsia, polyphagia and polyuria.  Genitourinary: Negative for dysuria, frequency, hematuria and urgency.  Musculoskeletal: Negative for back pain and neck stiffness.  Skin: Negative for rash.  Allergic/Immunologic: Negative for immunocompromised state.  Neurological: Negative for syncope, light-headedness and headaches.  Hematological: Does not  bruise/bleed easily.  Psychiatric/Behavioral: Negative for sleep disturbance. The patient is not nervous/anxious.   All other systems reviewed and are negative.    Physical Exam Updated Vital Signs BP (!) 75/50 (BP Location: Left Arm)   Pulse (!) 104   Temp 97.6 F (36.4 C) (Oral)   Resp 16   Ht  (1.702 m)   Wt 97.5 kg (215 lb)   SpO2 96%   BMI 33.67 kg/m   Physical Exam  Constitutional: She appears well-developed and well-nourished. No distress.  Awake, alert, nontoxic appearance  HENT:  Head: Normocephalic and atraumatic.  Mouth/Throat: Oropharynx is clear and moist. No oropharyngeal exudate.  Eyes: Conjunctivae are normal. No scleral icterus.  Neck: Normal range of motion. Neck supple.  Cardiovascular: Regular rhythm and intact distal pulses.  Tachycardia present.   Pulses:      Radial pulses are 1+ on the right side, and 1+ on the left side.  Pulmonary/Chest: Effort normal and breath sounds normal. No accessory muscle usage. Tachypnea noted. No respiratory distress. She has no decreased breath sounds. She has no wheezes. She has no rhonchi. She has no rales.  Equal chest expansion Clear and equal breath sounds with good tidal volume  Abdominal: Soft. Bowel sounds are normal. She exhibits no mass. There is no tenderness. There is no rigidity, no rebound, no guarding and no CVA tenderness.  Musculoskeletal: Normal range of motion. She exhibits no edema.  No peripheral edema No calf Tenderness  Neurological: She is alert.  Speech is clear and goal oriented Moves extremities without ataxia  Skin: Skin is warm and dry. She is not diaphoretic.  Psychiatric: She has a normal mood and affect.  Nursing note and vitals reviewed.    ED Treatments / Results  Labs (all labs ordered are listed, but only abnormal results are displayed) Labs Reviewed  BASIC METABOLIC PANEL - Abnormal; Notable for the following:       Result Value   CO2 14 (*)    Glucose, Bld 109 (*)     BUN 70 (*)    Creatinine, Ser 3.74 (*)    GFR calc non Af Amer 11 (*)    GFR calc Af Amer 13 (*)    All other components within normal limits  CBC - Abnormal; Notable for the following:    WBC 13.0 (*)    RBC 3.70 (*)    MCV 100.8 (*)    MCH 34.6 (*)    All other components within normal limits  D-DIMER, QUANTITATIVE (NOT AT [facility]) - Abnormal; Notable for the following:    D-Dimer, Quant 3.14 (*)    All other components within normal limits  CULTURE, BLOOD (ROUTINE X 2)  CULTURE, BLOOD (ROUTINE X 2)  HEPARIN LEVEL (UNFRACTIONATED)  URINALYSIS, ROUTINE W REFLEX MICROSCOPIC  TSH  I-STAT TROPONIN, ED  I-STAT CG4 LACTIC ACID, ED    EKG  EKG Interpretation  Date/Time:  Tuesday February 04 2017 20:40:20 EDT Ventricular Rate:  127 PR Interval:  164 QRS Duration: 62 QT Interval:  302 QTC Calculation: 438 R Axis:   62 Text Interpretation:  Sinus tachycardia Low voltage QRS Borderline ECG Confirmed by Gilda Crease [phone])  on 02/05/2017 12:56:38 AM       Radiology Dg Chest 2 View  Result Date: 02/04/2017 CLINICAL DATA:  Shortness of breath and cough for 1 week EXAM: CHEST  2 VIEW COMPARISON:  None. FINDINGS: The heart size and mediastinal contours are within normal limits. Both lungs are clear. The visualized skeletal structures are unremarkable. IMPRESSION: No active cardiopulmonary disease. Electronically Signed   By: Alcide CleverMark  Lukens M.D.   On: 02/04/2017 21:21   Nm Pulmonary Vent And Perf (v/q Scan)  Result Date: 02/05/2017 CLINICAL DATA:  PE suspected, intermediate prob, positive D-dimer. Cough and shortness of breath. EXAM: NUCLEAR MEDICINE VENTILATION - PERFUSION LUNG SCAN TECHNIQUE: Ventilation images were obtained in multiple projections using inhaled aerosol Tc-334m DTPA. Perfusion images were obtained in multiple projections after intravenous injection of Tc-374m MAA. RADIOPHARMACEUTICALS:  32.0 mCi Technetium-484m DTPA aerosol inhalation and 4.2 mCi Technetium-94m MAA  IV COMPARISON:  Chest radiograph earlier this day. FINDINGS: Ventilation: Diffusely patchy ventilation imaging, particularly in the right mid lung. There is central deposition of radiotracer. Perfusion: Profusion imaging more homogeneous than ventilation. Small matched defect in the periphery of the right mid lung. No ventilation perfusion mismatches. IMPRESSION: 1. Low probability for pulmonary embolus with a single matched ventilation perfusion defect in the right midlung, likely right lower lobe. 2. Diffusely heterogeneous ventilation imaging with central deposition of radiotracer in the trachea, suggesting airways disease/COPD. Electronically Signed   By: Rubye OaksMelanie  Ehinger M.D.   On: 02/05/2017 04:44    Procedures Procedures (including critical care time)  Medications Ordered in ED Medications  technetium TC 50M diethylenetriame-pentaacetic acid (DTPA) injection 30 millicurie (30 millicuries Intravenous Given 02/05/17 0305)  technetium albumin aggregated (MAA) injection solution 4 millicurie (4 millicuries Intravenous Contrast Given 02/05/17 0306)  sodium chloride 0.9 % bolus 500 mL (500 mLs Intravenous New Bag/Given 02/05/17 0424)  sodium chloride 0.9 % bolus 500 mL (500 mLs Intravenous New Bag/Given 02/05/17 0538)     Initial Impression / Assessment and Plan / ED Course  I have reviewed the triage vital signs and the nursing notes.  Pertinent labs & imaging results that were available during my care of the patient were reviewed by me and considered in my medical decision making (see chart for details).  Clinical Course as of Feb 06 544  Wed Feb 05, 2017  [id number] Discussed with Dr. Katrinka BlazingSmith of [facility].  Additional labs pending.  He will evaluate for admission.    [HM]    Clinical Course User Index [HM] Perl Folmar, Boyd KerbsHannah, PA-C    Patient presents emergency department with new acute renal failure, significant tachycardia, shortness of breath and elevated d-dimer. Concern for possible PE patient  is otherwise low risk. No hypoxia. She is hypotensive upon arrival to evaluation room. Fluids given Patient does not have a history of CHF. No pulmonary edema on chest x-ray and no peripheral edema. Patient without signs and symptoms of infection; doubt sepsis as the reason for her hypotension. She has been given fluids for her hypotension with some improvement.  She does have a mild leukocytosis at 13.0. Pulmonary ventilation/perfusion scan does not show a mismatch however patient was given heparin.  Pt admitted to stepdown unit for further treatment.  The patient was discussed with and seen by Dr. Blinda LeatherwoodPollina who agrees with the treatment plan.   Final Clinical Impressions(s) / ED Diagnoses   Final diagnoses:  Shortness of breath  Tachycardia  Hypotension, unspecified hypotension type  AKI (acute kidney injury) (HCC)  Cough    New  Prescriptions New Prescriptions   No medications on file     Milta Deiters 02/05/17 0548    Gilda Crease, MD 02/05/17 [phone]

## 2017-02-06 DIAGNOSIS — R6510 Systemic inflammatory response syndrome (SIRS) of non-infectious origin without acute organ dysfunction: Secondary | ICD-10-CM

## 2017-02-06 LAB — BASIC METABOLIC PANEL
Anion gap: 7 (ref 5–15)
BUN: 39 mg/dL — ABNORMAL HIGH (ref 6–20)
CALCIUM: 8.4 mg/dL — AB (ref 8.9–10.3)
CHLORIDE: 119 mmol/L — AB (ref 101–111)
CO2: 16 mmol/L — AB (ref 22–32)
Creatinine, Ser: 1.44 mg/dL — ABNORMAL HIGH (ref 0.44–1.00)
GFR calc Af Amer: 42 mL/min — ABNORMAL LOW (ref >60)
GFR calc non Af Amer: 36 mL/min — ABNORMAL LOW (ref >60)
Glucose, Bld: 97 mg/dL (ref 65–99)
POTASSIUM: 3.3 mmol/L — AB (ref 3.5–5.1)
Sodium: 142 mmol/L (ref 135–145)

## 2017-02-06 LAB — URINE CULTURE
Culture: NO GROWTH
Special Requests: NORMAL

## 2017-02-06 LAB — CBC
HEMATOCRIT: 30.7 % — AB (ref 36.0–46.0)
HEMOGLOBIN: 10.4 g/dL — AB (ref 12.0–15.0)
MCH: 34.3 pg — ABNORMAL HIGH (ref 26.0–34.0)
MCHC: 33.9 g/dL (ref 30.0–36.0)
MCV: 101.3 fL — ABNORMAL HIGH (ref 78.0–100.0)
Platelets: 162 10*3/uL (ref 150–400)
RBC: 3.03 MIL/uL — AB (ref 3.87–5.11)
RDW: 13 % (ref 11.5–15.5)
WBC: 10.3 10*3/uL (ref 4.0–10.5)

## 2017-02-06 MED ORDER — AZITHROMYCIN 500 MG PO TABS: 500.0000 mg | ORAL_TABLET | Freq: Every day | ORAL | 0 refills | Status: DC

## 2017-02-06 MED ORDER — AZITHROMYCIN 500 MG PO TABS
500.0000 mg | ORAL_TABLET | Freq: Every day | ORAL | Status: DC
  Administered 2017-02-06: 500 mg via ORAL
  Filled 2017-02-06: qty 1

## 2017-02-06 MED ORDER — FUROSEMIDE 40 MG PO TABS
40.0000 mg | ORAL_TABLET | Freq: Once | ORAL | Status: AC
  Administered 2017-02-06: 40 mg via ORAL
  Filled 2017-02-06: qty 1

## 2017-02-06 MED ORDER — POTASSIUM CHLORIDE CRYS ER 20 MEQ PO TBCR
20.0000 meq | EXTENDED_RELEASE_TABLET | Freq: Once | ORAL | Status: AC
  Administered 2017-02-06: 20 meq via ORAL
  Filled 2017-02-06: qty 1

## 2017-02-06 MED ORDER — POTASSIUM CHLORIDE CRYS ER 20 MEQ PO TBCR
40.0000 meq | EXTENDED_RELEASE_TABLET | Freq: Once | ORAL | Status: AC
  Administered 2017-02-06: 40 meq via ORAL
  Filled 2017-02-06: qty 2

## 2017-02-06 MED ORDER — PRAVASTATIN SODIUM 40 MG PO TABS: 20.0000 mg | ORAL_TABLET | Freq: Every day | ORAL | Status: DC

## 2017-02-06 NOTE — Care Management Obs Status (Signed)
MEDICARE OBSERVATION STATUS NOTIFICATION   Patient Details  Name: Courtney Levine MRN: [id number] Date of Birth: [birth year]   Medicare Observation Status Notification Given:  Yes    Cherylann ParrClaxton, Courtney Sobocinski S, RN 02/06/2017, 11:39 AM

## 2017-02-06 NOTE — Progress Notes (Signed)
Patient discharged by wheelchair with NT.   

## 2017-02-06 NOTE — Care Management Obs Status (Signed)
MEDICARE OBSERVATION STATUS NOTIFICATION   Patient Details  Name: Courtney Levine MRN: [id number] Date of Birth: January 24, 1951   Medicare Observation Status Notification Given:  Yes    Cherylann ParrClaxton, Sara Selvidge S, RN 02/06/2017, 11:38 AM

## 2017-02-06 NOTE — Progress Notes (Signed)
Discharge note. Patient and sister educated at bedside. RN educated on medications and when to take them, follow up appointments, smoking cessation, and when to call the MD. Patient was unreceptive to education on smoking cessation. PIV removed without complications.

## 2017-02-06 NOTE — Discharge Instructions (Signed)
Follow with Primary MD in 2-3 days   Get CBC, CMP, 2 view Chest X ray checked  by Primary MD in 2-3 days ( we routinely change or add medications that can affect your baseline labs and fluid status, therefore we recommend that you get the mentioned basic workup next visit with your PCP, your PCP may decide not to get them or add new tests based on their clinical decision)  Activity: As tolerated with Full fall precautions use walker/cane & assistance as needed  Disposition Home    Diet:    Heart Healthy    For Heart failure patients - Check your Weight same time everyday, if you gain over 2 pounds, or you develop in leg swelling, experience more shortness of breath or chest pain, call your Primary MD immediately. Follow Cardiac Low Salt Diet and 1.5 lit/day fluid restriction.  On your next visit with your primary care physician please Get Medicines reviewed and adjusted.  Please request your Prim.MD to go over all Hospital Tests and Procedure/Radiological results at the follow up, please get all Hospital records sent to your Prim MD by signing hospital release before you go home.  If you experience worsening of your admission symptoms, develop shortness of breath, life threatening emergency, suicidal or homicidal thoughts you must seek medical attention immediately by calling 911 or calling your MD immediately  if symptoms less severe.  You Must read complete instructions/literature along with all the possible adverse reactions/side effects for all the Medicines you take and that have been prescribed to you. Take any new Medicines after you have completely understood and accpet all the possible adverse reactions/side effects.   Do not drive, operate heavy machinery, perform activities at heights, swimming or participation in water activities or provide baby sitting services if your were admitted for syncope or siezures until you have seen by Primary MD or a Neurologist and advised to do so  again.  Do not drive when taking Pain medications.    Do not take more than prescribed Pain, Sleep and Anxiety Medications  Special Instructions: If you have smoked or chewed Tobacco  in the last 2 yrs please stop smoking, stop any regular Alcohol  and or any Recreational drug use.  Wear Seat belts while driving.   Please note  You were cared for by a hospitalist during your hospital stay. If you have any questions about your discharge medications or the care you received while you were in the hospital after you are discharged, you can call the unit and asked to speak with the hospitalist on call if the hospitalist that took care of you is not available. Once you are discharged, your primary care physician will handle any further medical issues. Please note that NO REFILLS for any discharge medications will be authorized once you are discharged, as it is imperative that you return to your primary care physician (or establish a relationship with a primary care physician if you do not have one) for your aftercare needs so that they can reassess your need for medications and monitor your lab values.

## 2017-02-06 NOTE — Care Management CC44 (Signed)
Condition Code 44 Documentation Completed  Patient Details  Name: Courtney Levine MRN: [id number] Date of Birth: 1951-01-21   Condition Code 44 given:  Yes Patient signature on Condition Code 44 notice:  Yes Documentation of 2 MD's agreement:  Yes Code 44 added to claim:  Yes    Cherylann ParrClaxton, Jadrian Bulman S, RN 02/06/2017, 11:39 AM

## 2017-02-06 NOTE — Discharge Summary (Signed)
Courtney Levine UJW:[id number] DOB: [birth year] DOA: 02/05/2017  PCP: Patient, No Pcp Per  Admit date: 02/05/2017  Discharge date: 02/06/2017  Admitted From: Home   Disposition:  Home   Recommendations for Outpatient Follow-up:   Follow up with PCP in 1-2 weeks  PCP Please obtain BMP/CBC, 2 view CXR in 1week,  (see Discharge instructions)   PCP Please follow up on the following pending results: Final Blood and Uribe culture results   Home Health: None   Equipment/Devices: None  Consultations: None Discharge Condition: Stable   CODE STATUS: Full   Diet Recommendation:  Heart Healthy    Chief Complaint  Patient presents with  . Shortness of Breath  . Cough     Brief history of present illness from the day of admission and additional interim summary     Courtney Levine a 66 y.o.femalewith medical history significant of HTN, HLD, and tobacco abuse; who presents with complaints of shortness of breath with productive cough over the last 5 days. Patient reports coughing up lostsclear sputum. She was seen by her PCP yesterday morning and thought to have a upper respiratory infection for which she was given an antibiotic and steroid in the office. The PCP also obtain lab work that resulted later that afternoon and the patient was instructed to come to the emergency department for further evaluation, she was found to be profoundly dehydrated, hypotensive with acute renal failure.                                                                 Hospital Course    1.SIRs due to Viral URI ( may have had an atypical infection but less likely) causing cough, diarrhea with profound Hypotension, dehydration and ARF. No clear signs of pneumonia, antibiotics given upon admission were continued for 24 hours, got aggressive IV  fluids, diarrhea self resolved,But pressure much better and renal function almost back to normal, she is now symptom free eager to go home, will be given 5 days of oral erythromycin to cover for atypical infection if that was the cause of her problems, hold her ACE inhibitor upon discharge, requested her to follow with PCP within a week to get her blood pressure and BMP rechecked. Sr. bedside, patient back to her baseline.  2. Smoking. Counseled to quit.  3. Elevated d-dimer. Due to acute illness, VQ scan low probability. No clinical suspicion of PE.  4. COPD. At baseline no wheezing, taper off steroids quickly. No acute issues.  5. Hypokalemia. Replaced.   Discharge diagnosis     Principal Problem:   SIRS (systemic inflammatory response syndrome) (HCC) Active Problems:   Transient hypotension   Tobacco abuse   Diarrhea   COPD with acute exacerbation (HCC)   ARF (acute renal failure) (HCC)   Dehydration  Discharge instructions    Discharge Instructions    Diet - low sodium heart healthy    Complete by:  As directed    Discharge instructions    Complete by:  As directed    Follow with Primary MD in 2-3 days   Get CBC, CMP, 2 view Chest X ray checked  by Primary MD in 2-3 days ( we routinely change or add medications that can affect your baseline labs and fluid status, therefore we recommend that you get the mentioned basic workup next visit with your PCP, your PCP may decide not to get them or add new tests based on their clinical decision)  Activity: As tolerated with Full fall precautions use walker/cane & assistance as needed  Disposition Home    Diet:    Heart Healthy    For Heart failure patients - Check your Weight same time everyday, if you gain over 2 pounds, or you develop in leg swelling, experience more shortness of breath or chest pain, call your Primary MD immediately. Follow Cardiac Low Salt Diet and 1.5 lit/day fluid restriction.  On your next visit  with your primary care physician please Get Medicines reviewed and adjusted.  Please request your Prim.MD to go over all Hospital Tests and Procedure/Radiological results at the follow up, please get all Hospital records sent to your Prim MD by signing hospital release before you go home.  If you experience worsening of your admission symptoms, develop shortness of breath, life threatening emergency, suicidal or homicidal thoughts you must seek medical attention immediately by calling 911 or calling your MD immediately  if symptoms less severe.  You Must read complete instructions/literature along with all the possible adverse reactions/side effects for all the Medicines you take and that have been prescribed to you. Take any new Medicines after you have completely understood and accpet all the possible adverse reactions/side effects.   Do not drive, operate heavy machinery, perform activities at heights, swimming or participation in water activities or provide baby sitting services if your were admitted for syncope or siezures until you have seen by Primary MD or a Neurologist and advised to do so again.  Do not drive when taking Pain medications.    Do not take more than prescribed Pain, Sleep and Anxiety Medications  Special Instructions: If you have smoked or chewed Tobacco  in the last 2 yrs please stop smoking, stop any regular Alcohol  and or any Recreational drug use.  Wear Seat belts while driving.   Please note  You were cared for by a hospitalist during your hospital stay. If you have any questions about your discharge medications or the care you received while you were in the hospital after you are discharged, you can call the unit and asked to speak with the hospitalist on call if the hospitalist that took care of you is not available. Once you are discharged, your primary care physician will handle any further medical issues. Please note that NO REFILLS for any discharge  medications will be authorized once you are discharged, as it is imperative that you return to your primary care physician (or establish a relationship with a primary care physician if you do not have one) for your aftercare needs so that they can reassess your need for medications and monitor your lab values.   Increase activity slowly    Complete by:  As directed       Discharge Medications   Allergies as of 02/06/2017   No  Known Allergies     Medication List    STOP taking these medications   levofloxacin 500 MG tablet Commonly known as:  LEVAQUIN   lisinopril 20 MG tablet Commonly known as:  PRINIVIL,ZESTRIL     TAKE these medications   albuterol 108 (90 Base) MCG/ACT inhaler Commonly known as:  PROVENTIL HFA;VENTOLIN HFA Inhale 2 puffs into the lungs every 4 (four) hours as needed for wheezing or shortness of breath.   azithromycin 500 MG tablet Commonly known as:  ZITHROMAX Take 1 tablet (500 mg total) by mouth daily.   lovastatin 20 MG tablet Commonly known as:  MEVACOR Take 20 mg by mouth daily.   predniSONE 20 MG tablet Commonly known as:  DELTASONE Take 20-60 mg by mouth See admin instructions. TAPER  daily x3d  daily x3d  daily x3d            Discharge Care Instructions        Start     Ordered   02/06/17 0000  azithromycin (ZITHROMAX) 500 MG tablet  Daily     02/06/17 0920   02/06/17 0000  Increase activity slowly     02/06/17 0920   02/06/17 0000  Diet - low sodium heart healthy     02/06/17 0920   02/06/17 0000  Discharge instructions    Comments:  Follow with Primary MD in 2-3 days   Get CBC, CMP, 2 view Chest X ray checked  by Primary MD in 2-3 days ( we routinely change or add medications that can affect your baseline labs and fluid status, therefore we recommend that you get the mentioned basic workup next visit with your PCP, your PCP may decide not to get them or add new tests based on their clinical decision)  Activity: As  tolerated with Full fall precautions use walker/cane & assistance as needed  Disposition Home    Diet:    Heart Healthy    For Heart failure patients - Check your Weight same time everyday, if you gain over 2 pounds, or you develop in leg swelling, experience more shortness of breath or chest pain, call your Primary MD immediately. Follow Cardiac Low Salt Diet and 1.5 lit/day fluid restriction.  On your next visit with your primary care physician please Get Medicines reviewed and adjusted.  Please request your Prim.MD to go over all Hospital Tests and Procedure/Radiological results at the follow up, please get all Hospital records sent to your Prim MD by signing hospital release before you go home.  If you experience worsening of your admission symptoms, develop shortness of breath, life threatening emergency, suicidal or homicidal thoughts you must seek medical attention immediately by calling 911 or calling your MD immediately  if symptoms less severe.  You Must read complete instructions/literature along with all the possible adverse reactions/side effects for all the Medicines you take and that have been prescribed to you. Take any new Medicines after you have completely understood and accpet all the possible adverse reactions/side effects.   Do not drive, operate heavy machinery, perform activities at heights, swimming or participation in water activities or provide baby sitting services if your were admitted for syncope or siezures until you have seen by Primary MD or a Neurologist and advised to do so again.  Do not drive when taking Pain medications.    Do not take more than prescribed Pain, Sleep and Anxiety Medications  Special Instructions: If you have smoked or chewed Tobacco  in the last 2 yrs  please stop smoking, stop any regular Alcohol  and or any Recreational drug use.  Wear Seat belts while driving.   Please note  You were cared for by a hospitalist during your  hospital stay. If you have any questions about your discharge medications or the care you received while you were in the hospital after you are discharged, you can call the unit and asked to speak with the hospitalist on call if the hospitalist that took care of you is not available. Once you are discharged, your primary care physician will handle any further medical issues. Please note that NO REFILLS for any discharge medications will be authorized once you are discharged, as it is imperative that you return to your primary care physician (or establish a relationship with a primary care physician if you do not have one) for your aftercare needs so that they can reassess your need for medications and monitor your lab values.   02/06/17 0920      Follow-up Information    PCP. Schedule an appointment as soon as possible for a visit in 1 week(s).   Why:  1 week or less          Major procedures and Radiology Reports - PLEASE review detailed and final reports thoroughly  -         Dg Chest 2 View  Result Date: 02/04/2017 CLINICAL DATA:  Shortness of breath and cough for 1 week EXAM: CHEST  2 VIEW COMPARISON:  None. FINDINGS: The heart size and mediastinal contours are within normal limits. Both lungs are clear. The visualized skeletal structures are unremarkable. IMPRESSION: No active cardiopulmonary disease. Electronically Signed   By: Alcide Clever M.D.   On: 02/04/2017 21:21   Nm Pulmonary Vent And Perf (v/q Scan)  Result Date: 02/05/2017 CLINICAL DATA:  PE suspected, intermediate prob, positive D-dimer. Cough and shortness of breath. EXAM: NUCLEAR MEDICINE VENTILATION - PERFUSION LUNG SCAN TECHNIQUE: Ventilation images were obtained in multiple projections using inhaled aerosol Tc-67m DTPA. Perfusion images were obtained in multiple projections after intravenous injection of Tc-40m MAA. RADIOPHARMACEUTICALS:  32.0 mCi Technetium-66m DTPA aerosol inhalation and 4.2 mCi Technetium-60m MAA IV  COMPARISON:  Chest radiograph earlier this day. FINDINGS: Ventilation: Diffusely patchy ventilation imaging, particularly in the right mid lung. There is central deposition of radiotracer. Perfusion: Profusion imaging more homogeneous than ventilation. Small matched defect in the periphery of the right mid lung. No ventilation perfusion mismatches. IMPRESSION: 1. Low probability for pulmonary embolus with a single matched ventilation perfusion defect in the right midlung, likely right lower lobe. 2. Diffusely heterogeneous ventilation imaging with central deposition of radiotracer in the trachea, suggesting airways disease/COPD. Electronically Signed   By: Rubye Oaks M.D.   On: 02/05/2017 04:44    Micro Results     Recent Results (from the past 240 hour(s))  Urine culture     Status: None   Collection Time: 02/05/17  6:04 AM  Result Value Ref Range Status   Specimen Description URINE, CLEAN CATCH  Final   Special Requests Normal  Final   Culture NO GROWTH  Final   Report Status 02/06/2017 FINAL  Final  MRSA PCR Screening     Status: None   Collection Time: 02/05/17  5:18 PM  Result Value Ref Range Status   MRSA by PCR NEGATIVE NEGATIVE Final    Comment:        The GeneXpert MRSA Assay (FDA approved for NASAL specimens only), is one component of a comprehensive  MRSA colonization surveillance program. It is not intended to diagnose MRSA infection nor to guide or monitor treatment for MRSA infections.     Today   Subjective    Courtney Levine today has no headache,no chest abdominal pain,no new weakness tingling or numbness, feels much better wants to go home today.     Objective   Blood pressure 108/70, pulse 74, temperature 97.9 F (36.6 C), temperature source Oral, resp. rate 16, height 5\' 7"  (1.702 m), weight 100.5 kg (221 lb 9 oz), SpO2 97 %.   Intake/Output Summary (Last 24 hours) at 02/06/17 0920 Last data filed at 02/06/17 0651  Gross per 24 hour  Intake           2123.33 ml  Output                0 ml  Net          2123.33 ml    Exam Awake Alert, Oriented x 3, No new F.N deficits, Normal affect Courtney Levine.AT,PERRAL Supple Neck,No JVD, No cervical lymphadenopathy appriciated.  Symmetrical Chest wall movement, Good air movement bilaterally, CTAB RRR,No Gallops,Rubs or new Murmurs, No Parasternal Heave +ve B.Sounds, Abd Soft, Non tender, No organomegaly appriciated, No rebound -guarding or rigidity. No Cyanosis, Clubbing or edema, No new Rash or bruise   Data Review   CBC w Diff: Lab Results  Component Value Date   WBC 10.3 02/06/2017   HGB 10.4 (L) 02/06/2017   HCT 30.7 (L) 02/06/2017   PLT 162 02/06/2017    CMP: Lab Results  Component Value Date   NA 142 02/06/2017   K 3.3 (L) 02/06/2017   CL 119 (H) 02/06/2017   CO2 16 (L) 02/06/2017   BUN 39 (H) 02/06/2017   CREATININE 1.44 (H) 02/06/2017   PROT 7.3 02/05/2017   ALBUMIN 3.6 02/05/2017   BILITOT 0.3 02/05/2017   ALKPHOS 68 02/05/2017   AST 25 02/05/2017   ALT 22 02/05/2017  .   Total Time in preparing paper work, data evaluation and todays exam - 35 minutes  Susa RaringPrashant Singh M.D on 02/06/2017 at 9:20 AM  [facility]   Office  [phone]

## 2017-02-06 NOTE — Progress Notes (Signed)
Pt is not MRSA positive. Microbiology called about the wrong pt being positive. I noticed the negative result in the chart and called microbiology to report the mixed results. Will continue to monitor.

## 2017-02-10 LAB — CULTURE, BLOOD (ROUTINE X 2)
CULTURE: NO GROWTH
Culture: NO GROWTH
Special Requests: ADEQUATE
Special Requests: ADEQUATE

## 2017-02-17 DIAGNOSIS — J208 Acute bronchitis due to other specified organisms: Secondary | ICD-10-CM | POA: Diagnosis not present

## 2017-02-17 DIAGNOSIS — Z23 Encounter for immunization: Secondary | ICD-10-CM | POA: Diagnosis not present

## 2017-02-17 DIAGNOSIS — J180 Bronchopneumonia, unspecified organism: Secondary | ICD-10-CM | POA: Diagnosis not present

## 2017-02-17 DIAGNOSIS — N178 Other acute kidney failure: Secondary | ICD-10-CM | POA: Diagnosis not present

## 2017-02-17 DIAGNOSIS — R799 Abnormal finding of blood chemistry, unspecified: Secondary | ICD-10-CM | POA: Diagnosis not present

## 2017-02-17 DIAGNOSIS — I10 Essential (primary) hypertension: Secondary | ICD-10-CM | POA: Diagnosis not present

## 2017-02-17 DIAGNOSIS — R05 Cough: Secondary | ICD-10-CM | POA: Diagnosis not present

## 2017-02-17 DIAGNOSIS — E782 Mixed hyperlipidemia: Secondary | ICD-10-CM | POA: Diagnosis not present

## 2017-02-17 DIAGNOSIS — R5383 Other fatigue: Secondary | ICD-10-CM | POA: Diagnosis not present

## 2017-02-17 DIAGNOSIS — R0602 Shortness of breath: Secondary | ICD-10-CM | POA: Diagnosis not present

## 2017-06-05 DIAGNOSIS — E782 Mixed hyperlipidemia: Secondary | ICD-10-CM | POA: Diagnosis not present

## 2017-06-05 DIAGNOSIS — I10 Essential (primary) hypertension: Secondary | ICD-10-CM | POA: Diagnosis not present

## 2017-06-05 DIAGNOSIS — R799 Abnormal finding of blood chemistry, unspecified: Secondary | ICD-10-CM | POA: Diagnosis not present

## 2017-06-05 DIAGNOSIS — J4520 Mild intermittent asthma, uncomplicated: Secondary | ICD-10-CM | POA: Diagnosis not present

## 2017-12-03 DIAGNOSIS — E782 Mixed hyperlipidemia: Secondary | ICD-10-CM | POA: Diagnosis not present

## 2017-12-03 DIAGNOSIS — J4520 Mild intermittent asthma, uncomplicated: Secondary | ICD-10-CM | POA: Diagnosis not present

## 2017-12-03 DIAGNOSIS — I10 Essential (primary) hypertension: Secondary | ICD-10-CM | POA: Diagnosis not present

## 2018-03-02 DIAGNOSIS — Z23 Encounter for immunization: Secondary | ICD-10-CM | POA: Diagnosis not present

## 2018-03-26 DIAGNOSIS — H25043 Posterior subcapsular polar age-related cataract, bilateral: Secondary | ICD-10-CM | POA: Diagnosis not present

## 2018-03-26 DIAGNOSIS — H2513 Age-related nuclear cataract, bilateral: Secondary | ICD-10-CM | POA: Diagnosis not present

## 2018-05-12 DIAGNOSIS — H2512 Age-related nuclear cataract, left eye: Secondary | ICD-10-CM | POA: Diagnosis not present

## 2018-06-09 DIAGNOSIS — I10 Essential (primary) hypertension: Secondary | ICD-10-CM | POA: Diagnosis not present

## 2018-06-09 DIAGNOSIS — Z79899 Other long term (current) drug therapy: Secondary | ICD-10-CM | POA: Diagnosis not present

## 2018-06-09 DIAGNOSIS — R69 Illness, unspecified: Secondary | ICD-10-CM | POA: Diagnosis not present

## 2018-06-09 DIAGNOSIS — H2512 Age-related nuclear cataract, left eye: Secondary | ICD-10-CM | POA: Diagnosis not present

## 2018-06-09 DIAGNOSIS — E785 Hyperlipidemia, unspecified: Secondary | ICD-10-CM | POA: Diagnosis not present

## 2018-06-09 DIAGNOSIS — H259 Unspecified age-related cataract: Secondary | ICD-10-CM | POA: Diagnosis not present

## 2018-06-09 DIAGNOSIS — M1990 Unspecified osteoarthritis, unspecified site: Secondary | ICD-10-CM | POA: Diagnosis not present

## 2018-07-21 DIAGNOSIS — E785 Hyperlipidemia, unspecified: Secondary | ICD-10-CM | POA: Diagnosis not present

## 2018-07-21 DIAGNOSIS — Z79899 Other long term (current) drug therapy: Secondary | ICD-10-CM | POA: Diagnosis not present

## 2018-07-21 DIAGNOSIS — M1990 Unspecified osteoarthritis, unspecified site: Secondary | ICD-10-CM | POA: Diagnosis not present

## 2018-07-21 DIAGNOSIS — H259 Unspecified age-related cataract: Secondary | ICD-10-CM | POA: Diagnosis not present

## 2018-07-21 DIAGNOSIS — I10 Essential (primary) hypertension: Secondary | ICD-10-CM | POA: Diagnosis not present

## 2018-07-21 DIAGNOSIS — H2511 Age-related nuclear cataract, right eye: Secondary | ICD-10-CM | POA: Diagnosis not present

## 2018-07-21 DIAGNOSIS — R69 Illness, unspecified: Secondary | ICD-10-CM | POA: Diagnosis not present

## 2018-07-22 DIAGNOSIS — E782 Mixed hyperlipidemia: Secondary | ICD-10-CM | POA: Diagnosis not present

## 2018-07-22 DIAGNOSIS — I10 Essential (primary) hypertension: Secondary | ICD-10-CM | POA: Diagnosis not present

## 2018-07-22 DIAGNOSIS — J4520 Mild intermittent asthma, uncomplicated: Secondary | ICD-10-CM | POA: Diagnosis not present

## 2018-09-28 DIAGNOSIS — J060 Acute laryngopharyngitis: Secondary | ICD-10-CM | POA: Diagnosis not present

## 2019-01-20 DIAGNOSIS — R69 Illness, unspecified: Secondary | ICD-10-CM | POA: Diagnosis not present

## 2019-01-20 DIAGNOSIS — J449 Chronic obstructive pulmonary disease, unspecified: Secondary | ICD-10-CM | POA: Diagnosis not present

## 2019-01-20 DIAGNOSIS — I10 Essential (primary) hypertension: Secondary | ICD-10-CM | POA: Diagnosis not present

## 2019-01-20 DIAGNOSIS — E782 Mixed hyperlipidemia: Secondary | ICD-10-CM | POA: Diagnosis not present

## 2019-01-20 DIAGNOSIS — R799 Abnormal finding of blood chemistry, unspecified: Secondary | ICD-10-CM | POA: Diagnosis not present

## 2019-02-24 DIAGNOSIS — Z23 Encounter for immunization: Secondary | ICD-10-CM | POA: Diagnosis not present

## 2019-05-01 IMAGING — NM NM PULMONARY VENT & PERF
13 series · 13 of 13 positions shown · non-contrast
Comparison: Chest radiograph earlier this day.

CLINICAL DATA: PE suspected, intermediate prob, positive D-dimer.
Cough and shortness of breath.

EXAM:
NUCLEAR MEDICINE VENTILATION - PERFUSION LUNG SCAN
TECHNIQUE: Ventilation images were obtained in multiple projections using
inhaled aerosol Nc-RRm DTPA. Perfusion images were obtained in
multiple projections after intravenous injection of Nc-RRm MAA.
RADIOPHARMACEUTICALS:  32.0 mCi Xechnetium-CCm DTPA aerosol
inhalation and 4.2 mCi Xechnetium-CCm MAA IV

[Series 1: ant/post vent · 4.14mm/px · 1 of 1 slices shown]
[im 1/1]
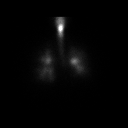

[Series 2: lao/rpo vent · 4.14mm/px · 1 of 1 slices shown (1 of 2)]
[im 1/1]
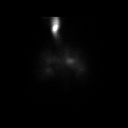

[Series 2: lao/rpo vent · 4.14mm/px · 1 of 1 slices shown (2 of 2)]
[im 1/1]
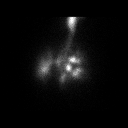

[Series 3: lpo/rao vent · 4.14mm/px · 1 of 1 slices shown (1 of 2)]
[im 1/1]
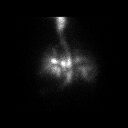

[Series 3: lpo/rao vent · 4.14mm/px · 1 of 1 slices shown (2 of 2)]
[im 1/1]
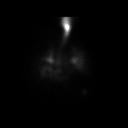

[Series 4: lt lat/rt lat vent · 4.14mm/px · 1 of 1 slices shown (1 of 2)]
[im 1/1]
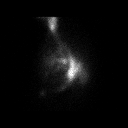

[Series 4: lt lat/rt lat vent · 4.14mm/px · 1 of 1 slices shown (2 of 2)]
[im 1/1]
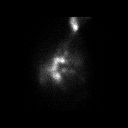

[Series 5: lpo/rao perf · 4.14mm/px · 1 of 1 slices shown (1 of 2)]
[im 1/1]
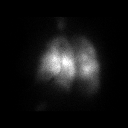

[Series 5: lpo/rao perf · 4.14mm/px · 1 of 1 slices shown (2 of 2)]
[im 1/1]
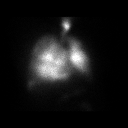

[Series 6: ant/post perf · 4.14mm/px · 1 of 1 slices shown (1 of 2)]
[im 1/1]
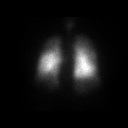

[Series 6: ant/post perf · 4.14mm/px · 1 of 1 slices shown (2 of 2)]
[im 1/1]
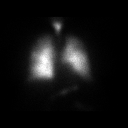

[Series 7: lao/rpo perf · 4.14mm/px · 1 of 1 slices shown (1 of 2)]
[im 1/1]
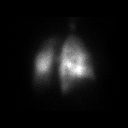

[Series 7: lao/rpo perf · 4.14mm/px · 1 of 1 slices shown (2 of 2)]
[im 1/1]
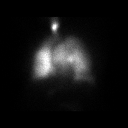

[13 of 13 positions shown; findings below may reference images not displayed]

FINDINGS: Ventilation: Diffusely patchy ventilation imaging, particularly in
the right mid lung. There is central deposition of radiotracer.

Perfusion: Profusion imaging more homogeneous than ventilation.
Small matched defect in the periphery of the right mid lung. No
ventilation perfusion mismatches.
IMPRESSION: 1. Low probability for pulmonary embolus with a single matched
ventilation perfusion defect in the right midlung, likely right
lower lobe.
2. Diffusely heterogeneous ventilation imaging with central
deposition of radiotracer in the trachea, suggesting airways
disease/COPD.

## 2019-05-13 DIAGNOSIS — H5316 Psychophysical visual disturbances: Secondary | ICD-10-CM | POA: Diagnosis not present

## 2019-07-22 ENCOUNTER — Other Ambulatory Visit: Payer: Self-pay

## 2019-07-22 ENCOUNTER — Encounter: Payer: Self-pay | Admitting: Physician Assistant

## 2019-07-22 ENCOUNTER — Ambulatory Visit (INDEPENDENT_AMBULATORY_CARE_PROVIDER_SITE_OTHER): Payer: Medicare HMO | Admitting: Physician Assistant

## 2019-07-22 VITALS — BP 124/80 | HR 71 | Temp 97.5°F | Resp 16 | Ht 66.0 in

## 2019-07-22 DIAGNOSIS — E782 Mixed hyperlipidemia: Secondary | ICD-10-CM | POA: Diagnosis not present

## 2019-07-22 DIAGNOSIS — I10 Essential (primary) hypertension: Secondary | ICD-10-CM | POA: Diagnosis not present

## 2019-07-22 DIAGNOSIS — J449 Chronic obstructive pulmonary disease, unspecified: Secondary | ICD-10-CM | POA: Insufficient documentation

## 2019-07-22 DIAGNOSIS — R799 Abnormal finding of blood chemistry, unspecified: Secondary | ICD-10-CM | POA: Diagnosis not present

## 2019-07-22 MED ORDER — LISINOPRIL 20 MG PO TABS: 20.0000 mg | ORAL_TABLET | Freq: Every day | ORAL | 1 refills | Status: DC

## 2019-07-22 NOTE — Assessment & Plan Note (Signed)
labwork pending Continue current meds as directed 

## 2019-07-22 NOTE — Progress Notes (Signed)
Established Patient Office Visit  Subjective:  Patient ID: Courtney Levine, female    DOB: 08/26/1950  Age: 69 y.o. MRN: [id number]  CC:  Chief Complaint  Patient presents with  . Follow-up  . Hyperlipidemia  . Hypertension    HPI Courtney Levine presents for follow up of COPD, hypertension and hyperlipidemia  Pt taking symbicort and albuterol as directed for COPD = has not had any recent flares of symptoms  Mixed hyperlipidemia  Pt presents with hyperlipidemia. . Compliance with treatment has been good; The patient is compliant with medications, maintains a low cholesterol diet , follows up as directed , and maintains an exercise regimen . The patient denies experiencing any hypercholesterolemia related symptoms.  Evidenced based information based on history , exam, and other sources has been used  for decision making. She is currently taking lovastatin 20mg  qd  Pt presents for follow up of hypertension. P The patient is tolerating the medication well without side effects. Compliance with treatment has been good; including taking medication as directed , maintains a healthy diet and regular exercise regimen , and following up as directed. Patient was evaluated using exam, physical, labs and other information to perform evidence based decision making.- pt currently taking lisinopril 20mg  qd  Past Medical History:  Diagnosis Date  . Hypercholesteremia   . Hypertension     Past Surgical History:  Procedure Laterality Date  . CATARACT EXTRACTION     2020    No family history on file.  Social History   Socioeconomic History  . Marital status: Single    Spouse name: Not on file  . Number of children: Not on file  . Years of education: Not on file  . Highest education level: Not on file  Occupational History  . Occupation: [employer]   part time  Tobacco Use  . Smoking status: Current Every Day Smoker    Packs/day: 0.75    Types: Cigarettes  . Smokeless  tobacco: Never Used  Substance and Sexual Activity  . Alcohol use: No  . Drug use: No  . Sexual activity: Not on file  Other Topics Concern  . Not on file  Social History Narrative  . Not on file   Social Determinants of Health   Financial Resource Strain:   . Difficulty of Paying Living Expenses: Not on file  Food Insecurity:   . Worried About in the Last Year: Not on file  . Ran Out of Food in the Last Year: Not on file  Transportation Needs:   . Lack of Transportation (Medical): Not on file  . Lack of Transportation (Non-Medical): Not on file  Physical Activity:   . Days of Exercise per Week: Not on file  . Minutes of Exercise per Session: Not on file  Stress:   . Feeling of Stress : Not on file  Social Connections:   . Frequency of Communication with Friends and Family: Not on file  . Frequency of Social Gatherings with Friends and Family: Not on file  . Attends Religious Services: Not on file  . Active Member of Clubs or Organizations: Not on file  . Attends 2021 Meetings: Not on file  . Marital Status: Not on file  Intimate Partner Violence:   . Fear of Current or Ex-Partner: Not on file  . Emotionally Abused: Not on file  . Physically Abused: Not on file  . Sexually Abused: Not on file  Current Outpatient Medications:  .  albuterol (PROVENTIL HFA;VENTOLIN HFA) 108 (90 Base) MCG/ACT inhaler, Inhale 2 puffs into the lungs every 4 (four) hours as needed for wheezing or shortness of breath., Disp: , Rfl:  .  lisinopril (ZESTRIL) 20 MG tablet, Take 1 tablet (20 mg total) by mouth daily., Disp: 90 tablet, Rfl: 1 .  lovastatin (MEVACOR) 20 MG tablet, Take 20 mg by mouth daily., Disp: , Rfl:  .  SYMBICORT 160-4.5 MCG/ACT inhaler, SMARTSIG:2 Puff(s) By Mouth Twice Daily, Disp: , Rfl:    No Known Allergies  ROS CONSTITUTIONAL: Negative for chills, fatigue, fever, unintentional weight gain and unintentional weight loss.  E/N/T:  Negative for ear pain, nasal congestion and sore throat.  CARDIOVASCULAR: Negative for chest pain, dizziness, palpitations and pedal edema.  RESPIRATORY: Negative for recent cough and dyspnea.  GASTROINTESTINAL: Negative for abdominal pain, acid reflux symptoms, constipation, diarrhea, nausea and vomiting.  MSK: Negative for arthralgias and myalgias.  INTEGUMENTARY: Negative for rash.  NEUROLOGICAL: Negative for dizziness and headaches.  PSYCHIATRIC: Negative for sleep disturbance and to question depression screen.  Negative for depression, negative for anhedonia.        Objective:    PHYSICAL EXAM:   VS: BP 124/80   Pulse 71   Temp (!) 97.5 F (36.4 C)   Resp 16   Ht 5\' 6"  (1.676 m)   SpO2 96%   BMI 35.76 kg/m   GEN: Well nourished, well developed, in no acute distress  HEENT: normal external ears and nose - normal external auditory canals and TMS - hearing grossly normal - normal nasal mucosa and septum - Lips, Teeth and Gums - normal  Oropharynx - normal mucosa, palate, and posterior pharynx Neck: no JVD or masses - no thyromegaly Cardiac: RRR; no murmurs, rubs, or gallops,no edema - no significant varicosities Respiratory:  normal respiratory rate and pattern with no distress - normal breath sounds with no rales, rhonchi, wheezes or rubs  Neuro:  Alert and Oriented x 3, Strength and sensation are intact - CN II-Xii grossly intact Psych: euthymic mood, appropriate affect and demeanor  BP 124/80   Pulse 71   Temp (!) 97.5 F (36.4 C)   Resp 16   Ht 5\' 6"  (1.676 m)   SpO2 96%   BMI 35.76 kg/m  Wt Readings from Last 3 Encounters:  02/05/17 221 lb 9 oz (100.5 kg)     Health Maintenance Due  Topic Date Due  . Hepatitis C Screening  1950-08-07  . TETANUS/TDAP  12/21/1967  . MAMMOGRAM  12/21/1998  . COLONOSCOPY  12/21/1998  . DEXA SCAN  12/20/2013  . PNA vac Low Risk Adult (2 of 2 - PCV13) 02/06/2018    There are no preventive care reminders to display for this  patient.  Lab Results  Component Value Date   TSH 1.969 02/04/2017   Lab Results  Component Value Date   WBC 10.3 02/06/2017   HGB 10.4 (L) 02/06/2017   HCT 30.7 (L) 02/06/2017   MCV 101.3 (H) 02/06/2017   PLT 162 02/06/2017   Lab Results  Component Value Date   NA 142 02/06/2017   K 3.3 (L) 02/06/2017   CO2 16 (L) 02/06/2017   GLUCOSE 97 02/06/2017   BUN 39 (H) 02/06/2017   CREATININE 1.44 (H) 02/06/2017   BILITOT 0.3 02/05/2017   ALKPHOS 68 02/05/2017   AST 25 02/05/2017   ALT 22 02/05/2017   PROT 7.3 02/05/2017   ALBUMIN 3.6 02/05/2017   CALCIUM  8.4 (L) 02/06/2017   ANIONGAP 7 02/06/2017   No results found for: CHOL No results found for: HDL No results found for: LDLCALC No results found for: TRIG No results found for: CHOLHDL No results found for: HGBA1C    Assessment & Plan:   Problem List Items Addressed This Visit      Cardiovascular and Mediastinum   Benign hypertension - Primary    labwork pending Continue current meds as directed      Relevant Medications   lisinopril (ZESTRIL) 20 MG tablet   Other Relevant Orders   CBC with Differential/Platelet   Comprehensive metabolic panel     Respiratory   Chronic obstructive pulmonary disease ([place])   Relevant Medications   SYMBICORT 160-4.5 MCG/ACT inhaler     Other   Mixed hyperlipidemia    labwork pending  Continue current meds as directed      Relevant Medications   lisinopril (ZESTRIL) 20 MG tablet   Other Relevant Orders   Lipid panel      Meds ordered this encounter  Medications  . lisinopril (ZESTRIL) 20 MG tablet    Sig: Take 1 tablet (20 mg total) by mouth daily.    Dispense:  90 tablet    Refill:  1    Order Specific Question:   Supervising Provider    AnswerShelton Silvas    Follow-up: Return in about 6 months (around 01/19/2020).    Courtney R Davin Archuletta, PA-C

## 2019-07-23 ENCOUNTER — Other Ambulatory Visit: Payer: Self-pay | Admitting: Physician Assistant

## 2019-07-23 DIAGNOSIS — R799 Abnormal finding of blood chemistry, unspecified: Secondary | ICD-10-CM

## 2019-07-23 LAB — COMPREHENSIVE METABOLIC PANEL
ALT: 23 IU/L (ref 0–32)
AST: 26 IU/L (ref 0–40)
Albumin/Globulin Ratio: 1.5 (ref 1.2–2.2)
Albumin: 4 g/dL (ref 3.8–4.8)
Alkaline Phosphatase: 99 IU/L (ref 39–117)
BUN/Creatinine Ratio: 10 — ABNORMAL LOW (ref 12–28)
BUN: 12 mg/dL (ref 8–27)
Bilirubin Total: 0.3 mg/dL (ref 0.0–1.2)
CO2: 18 mmol/L — ABNORMAL LOW (ref 20–29)
Calcium: 10 mg/dL (ref 8.7–10.3)
Chloride: 109 mmol/L — ABNORMAL HIGH (ref 96–106)
Creatinine, Ser: 1.16 mg/dL — ABNORMAL HIGH (ref 0.57–1.00)
GFR calc Af Amer: 55 mL/min/{1.73_m2} — ABNORMAL LOW (ref >59)
GFR calc non Af Amer: 48 mL/min/{1.73_m2} — ABNORMAL LOW (ref >59)
Globulin, Total: 2.6 g/dL (ref 1.5–4.5)
Glucose: 103 mg/dL — ABNORMAL HIGH (ref 65–99)
Potassium: 4.9 mmol/L (ref 3.5–5.2)
Sodium: 142 mmol/L (ref 134–144)
Total Protein: 6.6 g/dL (ref 6.0–8.5)

## 2019-07-23 LAB — LIPID PANEL
Chol/HDL Ratio: 3.6 ratio (ref 0.0–4.4)
Cholesterol, Total: 182 mg/dL (ref 100–199)
HDL: 51 mg/dL (ref >39)
LDL Chol Calc (NIH): 113 mg/dL — ABNORMAL HIGH (ref 0–99)
Triglycerides: 98 mg/dL (ref 0–149)
VLDL Cholesterol Cal: 18 mg/dL (ref 5–40)

## 2019-07-23 LAB — CARDIOVASCULAR RISK ASSESSMENT

## 2019-07-23 LAB — CBC WITH DIFFERENTIAL/PLATELET
Basophils Absolute: 0.1 10*3/uL (ref 0.0–0.2)
Basos: 1 %
EOS (ABSOLUTE): 0.3 10*3/uL (ref 0.0–0.4)
Eos: 4 %
Hematocrit: 36.7 % (ref 34.0–46.6)
Hemoglobin: 13 g/dL (ref 11.1–15.9)
Immature Grans (Abs): 0 10*3/uL (ref 0.0–0.1)
Immature Granulocytes: 0 %
Lymphocytes Absolute: 3.2 10*3/uL — ABNORMAL HIGH (ref 0.7–3.1)
Lymphs: 37 %
MCH: 36.7 pg — ABNORMAL HIGH (ref 26.6–33.0)
MCHC: 35.4 g/dL (ref 31.5–35.7)
MCV: 104 fL — ABNORMAL HIGH (ref 79–97)
Monocytes Absolute: 0.6 10*3/uL (ref 0.1–0.9)
Monocytes: 6 %
Neutrophils Absolute: 4.6 10*3/uL (ref 1.4–7.0)
Neutrophils: 52 %
Platelets: 192 10*3/uL (ref 150–450)
RBC: 3.54 x10E6/uL — ABNORMAL LOW (ref 3.77–5.28)
RDW: 12.7 % (ref 11.7–15.4)
WBC: 8.8 10*3/uL (ref 3.4–10.8)

## 2019-07-30 LAB — B12 AND FOLATE PANEL
Folate: >20 ng/mL (ref >3.0)
Vitamin B-12: 355 pg/mL (ref 232–1245)

## 2019-07-30 LAB — SPECIMEN STATUS REPORT

## 2019-07-30 LAB — TSH: TSH: 3.44 u[IU]/mL (ref 0.450–4.500)

## 2019-10-13 ENCOUNTER — Other Ambulatory Visit: Payer: Self-pay | Admitting: Physician Assistant

## 2020-01-19 ENCOUNTER — Ambulatory Visit: Payer: Medicare HMO | Admitting: Physician Assistant

## 2020-01-19 ENCOUNTER — Other Ambulatory Visit: Payer: Self-pay | Admitting: Physician Assistant

## 2020-01-19 MED ORDER — LOVASTATIN 20 MG PO TABS: 20.0000 mg | ORAL_TABLET | Freq: Every day | ORAL | 1 refills | Status: DC

## 2020-01-27 ENCOUNTER — Ambulatory Visit (INDEPENDENT_AMBULATORY_CARE_PROVIDER_SITE_OTHER): Payer: Medicare HMO | Admitting: Physician Assistant

## 2020-01-27 ENCOUNTER — Encounter: Payer: Self-pay | Admitting: Physician Assistant

## 2020-01-27 ENCOUNTER — Other Ambulatory Visit: Payer: Self-pay

## 2020-01-27 VITALS — BP 120/70 | HR 70 | Temp 97.4°F | Resp 17 | Ht 65.0 in | Wt 179.0 lb

## 2020-01-27 DIAGNOSIS — I10 Essential (primary) hypertension: Secondary | ICD-10-CM | POA: Diagnosis not present

## 2020-01-27 DIAGNOSIS — J060 Acute laryngopharyngitis: Secondary | ICD-10-CM | POA: Insufficient documentation

## 2020-01-27 DIAGNOSIS — E782 Mixed hyperlipidemia: Secondary | ICD-10-CM | POA: Diagnosis not present

## 2020-01-27 MED ORDER — AZITHROMYCIN 250 MG PO TABS: ORAL_TABLET | ORAL | 0 refills | Status: DC

## 2020-01-27 NOTE — Assessment & Plan Note (Signed)
Well controlled.  ?No changes to medicines.  ?Continue to work on eating a healthy diet and exercise.  ?Labs drawn today.  ?

## 2020-01-27 NOTE — Progress Notes (Signed)
Established Patient Office Visit  Subjective:  Patient ID: Courtney Levine, female    DOB: [birth year]  Age: 69 y.o. MRN: [id number]  CC:  Chief Complaint  Patient presents with  . Hypertension  . Hyperlipidemia    HPI [name] presents for follow up of COPD, hypertension and hyperlipidemia  Pt taking symbicort and albuterol as directed for COPD = has had mild congestion over past few weeks - no fever or malaise  Mixed hyperlipidemia  Pt presents with hyperlipidemia. . Compliance with treatment has been fair; The patient stopped her med but will restart if chol high again, maintains a low cholesterol diet , follows up as directed , and maintains an exercise regimen . The patient denies experiencing any hypercholesterolemia related symptoms.  Evidenced based information based on history , exam, and other sources has been used  for decision making.   Pt presents for follow up of hypertension. P The patient is tolerating the medication well without side effects. Compliance with treatment has been good; including taking medication as directed , maintains a healthy diet and regular exercise regimen , and following up as directed. Patient was evaluated using exam, physical, labs and other information to perform evidence based decision making.- pt currently taking lisinopril 20mg  qd  Past Medical History:  Diagnosis Date  . COPD (chronic obstructive pulmonary disease) (HCC)   . Hypercholesteremia   . Hypertension     Past Surgical History:  Procedure Laterality Date  . CATARACT EXTRACTION     2020    Family History  Problem Relation Age of Onset  . Pulmonary embolism Mother     Social History   Socioeconomic History  . Marital status: Single    Spouse name: Not on file  . Number of children: Not on file  . Years of education: Not on file  . Highest education level: Not on file  Occupational History  . Occupation: [employer]   part time  Tobacco Use  .  Smoking status: Current Every Day Smoker    Packs/day: 0.50    Types: Cigarettes  . Smokeless tobacco: Never Used  Vaping Use  . Vaping Use: Never used  Substance and Sexual Activity  . Alcohol use: No  . Drug use: No  . Sexual activity: Not Currently  Other Topics Concern  . Not on file  Social History Narrative  . Not on file   Social Determinants of Health   Financial Resource Strain:   . Difficulty of Paying Living Expenses: Not on file  Food Insecurity:   . Worried About 2021 in the Last Year: Not on file  . Ran Out of Food in the Last Year: Not on file  Transportation Needs:   . Lack of Transportation (Medical): Not on file  . Lack of Transportation (Non-Medical): Not on file  Physical Activity:   . Days of Exercise per Week: Not on file  . Minutes of Exercise per Session: Not on file  Stress:   . Feeling of Stress : Not on file  Social Connections:   . Frequency of Communication with Friends and Family: Not on file  . Frequency of Social Gatherings with Friends and Family: Not on file  . Attends Religious Services: Not on file  . Active Member of Clubs or Organizations: Not on file  . Attends Programme researcher, broadcasting/film/video Meetings: Not on file  . Marital Status: Not on file  Intimate Partner Violence:   . Fear of  Current or Ex-Partner: Not on file  . Emotionally Abused: Not on file  . Physically Abused: Not on file  . Sexually Abused: Not on file     Current Outpatient Medications:  .  albuterol (PROVENTIL HFA;VENTOLIN HFA) 108 (90 Base) MCG/ACT inhaler, Inhale 2 puffs into the lungs every 4 (four) hours as needed for wheezing or shortness of breath., Disp: , Rfl:  .  lisinopril (ZESTRIL) 20 MG tablet, Take 1 tablet (20 mg total) by mouth daily., Disp: 90 tablet, Rfl: 1 .  SYMBICORT 160-4.5 MCG/ACT inhaler, SMARTSIG:2 Puff(s) By Mouth Twice Daily, Disp: , Rfl:  .  azithromycin (ZITHROMAX) 250 MG tablet, 2 po day one then one po days 2-5, Disp: 6 tablet,  Rfl: 0 .  lovastatin (MEVACOR) 20 MG tablet, Take 1 tablet (20 mg total) by mouth daily. (Patient not taking: Reported on 01/27/2020), Disp: 90 tablet, Rfl: 1   No Known Allergies  ROS CONSTITUTIONAL: Negative for chills, fatigue, fever, unintentional weight gain and unintentional weight loss.  E/N/T: see HPI CARDIOVASCULAR: Negative for chest pain, dizziness, palpitations and pedal edema.  RESPIRATORY: see HPI GASTROINTESTINAL: Negative for abdominal pain, acid reflux symptoms, constipation, diarrhea, nausea and vomiting.   PSYCHIATRIC: Negative for sleep disturbance and to question depression screen.  Negative for depression, negative for anhedonia.        Objective:    PHYSICAL EXAM:   VS: BP 120/70   Pulse 70   Temp (!) 97.4 F (36.3 C)   Resp 17   Ht 5\' 5"  (1.651 m)   Wt 179 lb (81.2 kg)   BMI 29.79 kg/m   GEN: Well nourished, well developed, in no acute distress  HEENT: normal external ears and nose - normal external auditory canals and TMS -  Oropharynx - normal mucosa, palate, and posterior pharynx Cardiac: RRR; no murmurs, rubs, or gallops,no edema -  Respiratory:  normal respiratory rate and pattern with no distress - normal breath sounds with no rales, rhonchi, wheezes or rubs  Neuro:  Alert and Oriented x 3, Strength and sensation are intact - CN II-Xii grossly intact Psych: euthymic mood, appropriate affect and demeanor  BP 120/70   Pulse 70   Temp (!) 97.4 F (36.3 C)   Resp 17   Ht 5\' 5"  (1.651 m)   Wt 179 lb (81.2 kg)   BMI 29.79 kg/m  Wt Readings from Last 3 Encounters:  01/27/20 179 lb (81.2 kg)  02/05/17 221 lb 9 oz (100.5 kg)     There are no preventive care reminders to display for this patient.  There are no preventive care reminders to display for this patient.  Lab Results  Component Value Date   TSH 3.440 07/22/2019   Lab Results  Component Value Date   WBC 8.8 07/22/2019   HGB 13.0 07/22/2019   HCT 36.7 07/22/2019   MCV 104  (H) 07/22/2019   PLT 192 07/22/2019   Lab Results  Component Value Date   NA 142 07/22/2019   K 4.9 07/22/2019   CO2 18 (L) 07/22/2019   GLUCOSE 103 (H) 07/22/2019   BUN 12 07/22/2019   CREATININE 1.16 (H) 07/22/2019   BILITOT 0.3 07/22/2019   ALKPHOS 99 07/22/2019   AST 26 07/22/2019   ALT 23 07/22/2019   PROT 6.6 07/22/2019   ALBUMIN 4.0 07/22/2019   CALCIUM 10.0 07/22/2019   ANIONGAP 7 02/06/2017   Lab Results  Component Value Date   CHOL 182 07/22/2019   Lab Results  Component Value Date   HDL 51 07/22/2019   Lab Results  Component Value Date   LDLCALC 113 (H) 07/22/2019   Lab Results  Component Value Date   TRIG 98 07/22/2019   Lab Results  Component Value Date   CHOLHDL 3.6 07/22/2019   No results found for: HGBA1C    Assessment & Plan:   Problem List Items Addressed This Visit      Cardiovascular and Mediastinum   Benign hypertension - Primary    Well controlled.  No changes to medicines.  Continue to work on eating a healthy diet and exercise.  Labs drawn today.        Relevant Orders   CBC with Differential/Platelet   Comprehensive metabolic panel     Respiratory   Acute laryngopharyngitis    rx for zpack as directed otc decongestants        Other   Mixed hyperlipidemia     Continue to work on eating a healthy diet and exercise.  Labs drawn today.        Relevant Orders   Lipid panel      Meds ordered this encounter  Medications  . azithromycin (ZITHROMAX) 250 MG tablet    Sig: 2 po day one then one po days 2-5    Dispense:  6 tablet    Refill:  0    Order Specific Question:   Supervising Provider    AnswerCorey Harold    Follow-up: Return in about 6 months (around 07/26/2020) for chronic fasting follow up.    SARA R Declin Rajan, PA-C

## 2020-01-27 NOTE — Assessment & Plan Note (Addendum)
Continue to work on eating a healthy diet and exercise.  Labs drawn today.  

## 2020-01-27 NOTE — Assessment & Plan Note (Signed)
rx for zpack as directed otc decongestants

## 2020-01-28 ENCOUNTER — Other Ambulatory Visit: Payer: Self-pay | Admitting: Physician Assistant

## 2020-01-28 ENCOUNTER — Other Ambulatory Visit: Payer: Self-pay

## 2020-01-28 LAB — COMPREHENSIVE METABOLIC PANEL
ALT: 20 IU/L (ref 0–32)
AST: 24 IU/L (ref 0–40)
Albumin/Globulin Ratio: 1.5 (ref 1.2–2.2)
Albumin: 4.1 g/dL (ref 3.7–4.7)
Alkaline Phosphatase: 127 IU/L — ABNORMAL HIGH (ref 48–121)
BUN/Creatinine Ratio: 8 — ABNORMAL LOW (ref 12–28)
BUN: 8 mg/dL (ref 8–27)
Bilirubin Total: 0.3 mg/dL (ref 0.0–1.2)
CO2: 21 mmol/L (ref 20–29)
Calcium: 9.9 mg/dL (ref 8.7–10.3)
Chloride: 101 mmol/L (ref 96–106)
Creatinine, Ser: 0.96 mg/dL (ref 0.57–1.00)
GFR calc Af Amer: 69 mL/min/{1.73_m2} (ref >59)
GFR calc non Af Amer: 60 mL/min/{1.73_m2} (ref >59)
Globulin, Total: 2.8 g/dL (ref 1.5–4.5)
Glucose: 106 mg/dL — ABNORMAL HIGH (ref 65–99)
Potassium: 4.6 mmol/L (ref 3.5–5.2)
Sodium: 134 mmol/L (ref 134–144)
Total Protein: 6.9 g/dL (ref 6.0–8.5)

## 2020-01-28 LAB — CBC WITH DIFFERENTIAL/PLATELET
Basophils Absolute: 0.1 10*3/uL (ref 0.0–0.2)
Basos: 1 %
EOS (ABSOLUTE): 0.3 10*3/uL (ref 0.0–0.4)
Eos: 3 %
Hematocrit: 34.9 % (ref 34.0–46.6)
Hemoglobin: 12.5 g/dL (ref 11.1–15.9)
Immature Grans (Abs): 0 10*3/uL (ref 0.0–0.1)
Immature Granulocytes: 0 %
Lymphocytes Absolute: 3 10*3/uL (ref 0.7–3.1)
Lymphs: 35 %
MCH: 36 pg — ABNORMAL HIGH (ref 26.6–33.0)
MCHC: 35.8 g/dL — ABNORMAL HIGH (ref 31.5–35.7)
MCV: 101 fL — ABNORMAL HIGH (ref 79–97)
Monocytes Absolute: 0.6 10*3/uL (ref 0.1–0.9)
Monocytes: 7 %
Neutrophils Absolute: 4.6 10*3/uL (ref 1.4–7.0)
Neutrophils: 54 %
Platelets: 233 10*3/uL (ref 150–450)
RBC: 3.47 x10E6/uL — ABNORMAL LOW (ref 3.77–5.28)
RDW: 12.9 % (ref 11.7–15.4)
WBC: 8.6 10*3/uL (ref 3.4–10.8)

## 2020-01-28 LAB — LIPID PANEL
Chol/HDL Ratio: 5.1 ratio — ABNORMAL HIGH (ref 0.0–4.4)
Cholesterol, Total: 211 mg/dL — ABNORMAL HIGH (ref 100–199)
HDL: 41 mg/dL (ref >39)
LDL Chol Calc (NIH): 146 mg/dL — ABNORMAL HIGH (ref 0–99)
Triglycerides: 133 mg/dL (ref 0–149)
VLDL Cholesterol Cal: 24 mg/dL (ref 5–40)

## 2020-01-28 LAB — CARDIOVASCULAR RISK ASSESSMENT

## 2020-01-28 MED ORDER — LISINOPRIL 20 MG PO TABS: 20.0000 mg | ORAL_TABLET | Freq: Every day | ORAL | 1 refills | Status: DC

## 2020-01-28 MED ORDER — LOVASTATIN 40 MG PO TABS: 40.0000 mg | ORAL_TABLET | Freq: Every day | ORAL | 1 refills | Status: DC

## 2020-01-30 ENCOUNTER — Other Ambulatory Visit: Payer: Self-pay | Admitting: Physician Assistant

## 2020-03-09 ENCOUNTER — Ambulatory Visit (INDEPENDENT_AMBULATORY_CARE_PROVIDER_SITE_OTHER): Payer: Medicare HMO

## 2020-03-09 ENCOUNTER — Encounter: Payer: Self-pay | Admitting: Physician Assistant

## 2020-03-09 DIAGNOSIS — Z23 Encounter for immunization: Secondary | ICD-10-CM

## 2020-03-09 NOTE — Progress Notes (Signed)
    Patient Name: Courtney Levine Patient DOB: [birth year]  03/09/2020  [name] came in today for her 2021-2022 influenza vaccine.  Patient tolerated it well.   [name]

## 2020-04-12 ENCOUNTER — Other Ambulatory Visit: Payer: Self-pay | Admitting: Physician Assistant

## 2020-05-18 ENCOUNTER — Other Ambulatory Visit: Payer: Self-pay | Admitting: Physician Assistant

## 2020-05-18 MED ORDER — SYMBICORT 160-4.5 MCG/ACT IN AERO: 2.0000 | INHALATION_SPRAY | Freq: Two times a day (BID) | RESPIRATORY_TRACT | 3 refills | Status: DC

## 2020-07-28 ENCOUNTER — Encounter: Payer: Self-pay | Admitting: Physician Assistant

## 2020-07-28 ENCOUNTER — Other Ambulatory Visit: Payer: Self-pay

## 2020-07-28 ENCOUNTER — Ambulatory Visit (INDEPENDENT_AMBULATORY_CARE_PROVIDER_SITE_OTHER): Payer: Medicare HMO | Admitting: Physician Assistant

## 2020-07-28 VITALS — BP 122/76 | HR 78 | Temp 96.0°F | Ht 65.0 in | Wt 149.4 lb

## 2020-07-28 DIAGNOSIS — R634 Abnormal weight loss: Secondary | ICD-10-CM

## 2020-07-28 DIAGNOSIS — E782 Mixed hyperlipidemia: Secondary | ICD-10-CM | POA: Diagnosis not present

## 2020-07-28 DIAGNOSIS — J060 Acute laryngopharyngitis: Secondary | ICD-10-CM

## 2020-07-28 DIAGNOSIS — I10 Essential (primary) hypertension: Secondary | ICD-10-CM | POA: Diagnosis not present

## 2020-07-28 MED ORDER — BUDESONIDE-FORMOTEROL FUMARATE 160-4.5 MCG/ACT IN AERO
2.0000 | INHALATION_SPRAY | Freq: Two times a day (BID) | RESPIRATORY_TRACT | 3 refills | Status: DC
Start: 2020-07-28 — End: 2020-11-15

## 2020-07-28 MED ORDER — LOVASTATIN 20 MG PO TABS: 20.0000 mg | ORAL_TABLET | Freq: Every day | ORAL | 1 refills | Status: DC

## 2020-07-28 MED ORDER — TRIAMCINOLONE ACETONIDE 40 MG/ML IJ SUSP
60.0000 mg | Freq: Once | INTRAMUSCULAR | Status: AC
  Administered 2020-07-28: 60 mg via INTRAMUSCULAR

## 2020-07-28 MED ORDER — LISINOPRIL 20 MG PO TABS
20.0000 mg | ORAL_TABLET | Freq: Every day | ORAL | 1 refills | Status: DC
Start: 2020-07-28 — End: 2021-02-19

## 2020-07-28 MED ORDER — AMOXICILLIN 875 MG PO TABS: 875.0000 mg | ORAL_TABLET | Freq: Two times a day (BID) | ORAL | 0 refills | Status: DC

## 2020-07-28 NOTE — Progress Notes (Signed)
Established Patient Office Visit  Subjective:  Patient ID: Courtney Levine, female    DOB: 09-Jan-1951  Age: 70 y.o. MRN: [id number]  CC:  Chief Complaint  Patient presents with  . Hypertension    50M Fasting    HPI Courtney Levine presents for follow up of COPD, hypertension and hyperlipidemia  Pt taking symbicort and albuterol as directed for COPD = has had mild congestion over past few weeks - no fever or malaise - cough has been slightly productive and pt requests kenalog shot  Mixed hyperlipidemia  Pt presents with hyperlipidemia. . Compliance with treatment has been fair;the patient requests to have the lovastatin changed back to 20mg  qd because of achiness in her legs - has not been taking med regularly  Pt presents for follow up of hypertension.  The patient is tolerating the medication well without side effects. Compliance with treatment has been good; including taking medication as directed ,  , and following up as directed. .- pt currently taking lisinopril 20mg  qd  It is noted that patient has lost 30 pounds since last visit - although pt states she has changed her diet I shared concern this is quite a bit to have lost in 6 months with just mild diet changes - Recommend labwork which she agrees too however I also recommended mammogram, colonoscopy, and chest xray all of which she refuses  Past Medical History:  Diagnosis Date  . COPD (chronic obstructive pulmonary disease) (HCC)   . Hypercholesteremia   . Hypertension     Past Surgical History:  Procedure Laterality Date  . CATARACT EXTRACTION     2020    Family History  Problem Relation Age of Onset  . Pulmonary embolism Mother     Social History   Socioeconomic History  . Marital status: Single    Spouse name: Not on file  . Number of children: Not on file  . Years of education: Not on file  . Highest education level: Not on file  Occupational History  . Occupation: [employer]   part time   Tobacco Use  . Smoking status: Current Every Day Smoker    Packs/day: 0.50    Types: Cigarettes  . Smokeless tobacco: Never Used  Vaping Use  . Vaping Use: Never used  Substance and Sexual Activity  . Alcohol use: No  . Drug use: No  . Sexual activity: Not Currently  Other Topics Concern  . Not on file  Social History Narrative  . Not on file   Social Determinants of Health   Financial Resource Strain: Not on file  Food Insecurity: Not on file  Transportation Needs: Not on file  Physical Activity: Not on file  Stress: Not on file  Social Connections: Not on file  Intimate Partner Violence: Not on file     Current Outpatient Medications:  .  albuterol (PROVENTIL HFA;VENTOLIN HFA) 108 (90 Base) MCG/ACT inhaler, Inhale 2 puffs into the lungs every 4 (four) hours as needed for wheezing or shortness of breath., Disp: , Rfl:  .  amoxicillin (AMOXIL) 875 MG tablet, Take 1 tablet (875 mg total) by mouth 2 (two) times daily., Disp: 20 tablet, Rfl: 0 .  lovastatin (MEVACOR) 20 MG tablet, Take 1 tablet (20 mg total) by mouth at bedtime., Disp: 90 tablet, Rfl: 1 .  budesonide-formoterol (SYMBICORT) 160-4.5 MCG/ACT inhaler, Inhale 2 puffs into the lungs 2 (two) times daily., Disp: 11 g, Rfl: 3 .  lisinopril (ZESTRIL) 20 MG tablet,  Take 1 tablet (20 mg total) by mouth daily., Disp: 90 tablet, Rfl: 1  Current Facility-Administered Medications:  .  triamcinolone acetonide (KENALOG-40) injection 60 mg, 60 mg, Intramuscular, Once, Marianne Sofia, PA-C   No Known Allergies  ROS CONSTITUTIONAL: see HPI E/N/T: Negative for ear pain, nasal congestion and sore throat.  CARDIOVASCULAR: Negative for chest pain, dizziness, palpitations and pedal edema.  RESPIRATORY: see HPI GASTROINTESTINAL: Negative for abdominal pain, acid reflux symptoms, constipation, diarrhea, nausea and vomiting.  MSK: Negative for arthralgias and myalgias.  INTEGUMENTARY: Negative for rash.  NEUROLOGICAL: Negative for  dizziness and headaches.  PSYCHIATRIC: Negative for sleep disturbance and to question depression screen.  Negative for depression, negative for anhedonia.            Objective:    PHYSICAL EXAM:   VS: BP 122/76 (BP Location: Right Arm, Patient Position: Sitting, Cuff Size: Normal)   Pulse 78   Temp (!) 96 F (35.6 C) (Temporal)   Ht 5\' 5"  (1.651 m)   Wt 149 lb 6.4 oz (67.8 kg)   SpO2 94%   BMI 24.86 kg/m    PHYSICAL EXAM:   VS: BP 122/76 (BP Location: Right Arm, Patient Position: Sitting, Cuff Size: Normal)   Pulse 78   Temp (!) 96 F (35.6 C) (Temporal)   Ht 5\' 5"  (1.651 m)   Wt 149 lb 6.4 oz (67.8 kg)   SpO2 94%   BMI 24.86 kg/m   GEN: Well nourished, well developed, in no acute distress  Oropharynx -erythema/pnd Cardiac: RRR; no murmurs, rubs, or gallops,no edema -  Respiratory:  normal respiratory rate and pattern with no distress -scattered rhonchi noted GI: normal bowel sounds, no masses or tenderness Skin: warm and dry, no rash  Neuro:  Alert and Oriented x 3,  - CN II-Xii grossly intact Psych: euthymic mood, appropriate affect and demeanor   BP 122/76 (BP Location: Right Arm, Patient Position: Sitting, Cuff Size: Normal)   Pulse 78   Temp (!) 96 F (35.6 C) (Temporal)   Ht 5\' 5"  (1.651 m)   Wt 149 lb 6.4 oz (67.8 kg)   SpO2 94%   BMI 24.86 kg/m  Wt Readings from Last 3 Encounters:  07/28/20 149 lb 6.4 oz (67.8 kg)  01/27/20 179 lb (81.2 kg)  02/05/17 221 lb 9 oz (100.5 kg)     Health Maintenance Due  Topic Date Due  . COVID-19 Vaccine (3 - Booster for Pfizer series) 05/28/2020    There are no preventive care reminders to display for this patient.  Lab Results  Component Value Date   TSH 3.440 07/22/2019   Lab Results  Component Value Date   WBC 8.6 01/27/2020   HGB 12.5 01/27/2020   HCT 34.9 01/27/2020   MCV 101 (H) 01/27/2020   PLT 233 01/27/2020   Lab Results  Component Value Date   NA 134 01/27/2020   K 4.6 01/27/2020   CO2  21 01/27/2020   GLUCOSE 106 (H) 01/27/2020   BUN 8 01/27/2020   CREATININE 0.96 01/27/2020   BILITOT 0.3 01/27/2020   ALKPHOS 127 (H) 01/27/2020   AST 24 01/27/2020   ALT 20 01/27/2020   PROT 6.9 01/27/2020   ALBUMIN 4.1 01/27/2020   CALCIUM 9.9 01/27/2020   ANIONGAP 7 02/06/2017   Lab Results  Component Value Date   CHOL 211 (H) 01/27/2020   Lab Results  Component Value Date   HDL 41 01/27/2020   Lab Results  Component Value Date  LDLCALC 146 (H) 01/27/2020   Lab Results  Component Value Date   TRIG 133 01/27/2020   Lab Results  Component Value Date   CHOLHDL 5.1 (H) 01/27/2020   No results found for: HGBA1C    Assessment & Plan:   Problem List Items Addressed This Visit      Cardiovascular and Mediastinum   Benign hypertension - Primary   Relevant Medications   lisinopril (ZESTRIL) 20 MG tablet   lovastatin (MEVACOR) 20 MG tablet   Other Relevant Orders   CBC with Differential/Platelet   Comprehensive metabolic panel   TSH     Respiratory   Acute laryngopharyngitis   Relevant Medications   triamcinolone acetonide (KENALOG-40) injection 60 mg (Start on 07/28/2020  9:15 AM)   amoxicillin (AMOXIL) 875 MG tablet   budesonide-formoterol (SYMBICORT) 160-4.5 MCG/ACT inhaler     Other   Mixed hyperlipidemia   Relevant Medications   lisinopril (ZESTRIL) 20 MG tablet   lovastatin (MEVACOR) 20 MG tablet   Other Relevant Orders   Lipid panel    Other Visit Diagnoses    Weight loss       Relevant Orders   CBC with Differential/Platelet   Comprehensive metabolic panel   TSH   Lipid panel   Hemoglobin A1c      Meds ordered this encounter  Medications  . triamcinolone acetonide (KENALOG-40) injection 60 mg  . amoxicillin (AMOXIL) 875 MG tablet    Sig: Take 1 tablet (875 mg total) by mouth 2 (two) times daily.    Dispense:  20 tablet    Refill:  0    Order Specific Question:   Supervising Provider    AnswerBlane Ohara [id number]  .  budesonide-formoterol (SYMBICORT) 160-4.5 MCG/ACT inhaler    Sig: Inhale 2 puffs into the lungs 2 (two) times daily.    Dispense:  11 g    Refill:  3    Order Specific Question:   Supervising Provider    AnswerBlane Ohara [id number]  . lisinopril (ZESTRIL) 20 MG tablet    Sig: Take 1 tablet (20 mg total) by mouth daily.    Dispense:  90 tablet    Refill:  1    Order Specific Question:   Supervising Provider    AnswerBlane Ohara [id number]  . lovastatin (MEVACOR) 20 MG tablet    Sig: Take 1 tablet (20 mg total) by mouth at bedtime.    Dispense:  90 tablet    Refill:  1    Order Specific Question:   Supervising Provider    Answer:   Corey Harold    Follow-up: Return in about 4 weeks (around 08/25/2020) for follow up.    SARA R DAVIS, PA-C

## 2020-07-29 LAB — COMPREHENSIVE METABOLIC PANEL
ALT: 16 IU/L (ref 0–32)
AST: 22 IU/L (ref 0–40)
Albumin/Globulin Ratio: 1.4 (ref 1.2–2.2)
Albumin: 4.2 g/dL (ref 3.7–4.7)
Alkaline Phosphatase: 88 IU/L (ref 44–121)
BUN/Creatinine Ratio: 12 (ref 12–28)
BUN: 12 mg/dL (ref 8–27)
Bilirubin Total: 0.4 mg/dL (ref 0.0–1.2)
CO2: 20 mmol/L (ref 20–29)
Calcium: 10.3 mg/dL (ref 8.7–10.3)
Chloride: 106 mmol/L (ref 96–106)
Creatinine, Ser: 0.97 mg/dL (ref 0.57–1.00)
Globulin, Total: 3 g/dL (ref 1.5–4.5)
Glucose: 97 mg/dL (ref 65–99)
Potassium: 5.6 mmol/L — ABNORMAL HIGH (ref 3.5–5.2)
Sodium: 142 mmol/L (ref 134–144)
Total Protein: 7.2 g/dL (ref 6.0–8.5)
eGFR: 62 mL/min/{1.73_m2} (ref >59)

## 2020-07-29 LAB — CBC WITH DIFFERENTIAL/PLATELET
Basophils Absolute: 0.1 10*3/uL (ref 0.0–0.2)
Basos: 1 %
EOS (ABSOLUTE): 0.4 10*3/uL (ref 0.0–0.4)
Eos: 5 %
Hematocrit: 35.9 % (ref 34.0–46.6)
Hemoglobin: 13.3 g/dL (ref 11.1–15.9)
Immature Grans (Abs): 0 10*3/uL (ref 0.0–0.1)
Immature Granulocytes: 1 %
Lymphocytes Absolute: 3.4 10*3/uL — ABNORMAL HIGH (ref 0.7–3.1)
Lymphs: 40 %
MCH: 38.6 pg — ABNORMAL HIGH (ref 26.6–33.0)
MCHC: 37 g/dL — ABNORMAL HIGH (ref 31.5–35.7)
MCV: 104 fL — ABNORMAL HIGH (ref 79–97)
Monocytes Absolute: 0.6 10*3/uL (ref 0.1–0.9)
Monocytes: 7 %
Neutrophils Absolute: 4 10*3/uL (ref 1.4–7.0)
Neutrophils: 46 %
Platelets: 177 10*3/uL (ref 150–450)
RBC: 3.45 x10E6/uL — ABNORMAL LOW (ref 3.77–5.28)
RDW: 13.4 % (ref 11.7–15.4)
WBC: 8.6 10*3/uL (ref 3.4–10.8)

## 2020-07-29 LAB — LIPID PANEL
Chol/HDL Ratio: 3.3 ratio (ref 0.0–4.4)
Cholesterol, Total: 183 mg/dL (ref 100–199)
HDL: 56 mg/dL (ref >39)
LDL Chol Calc (NIH): 113 mg/dL — ABNORMAL HIGH (ref 0–99)
Triglycerides: 74 mg/dL (ref 0–149)
VLDL Cholesterol Cal: 14 mg/dL (ref 5–40)

## 2020-07-29 LAB — CARDIOVASCULAR RISK ASSESSMENT

## 2020-07-29 LAB — TSH: TSH: 2.66 u[IU]/mL (ref 0.450–4.500)

## 2020-07-29 LAB — HEMOGLOBIN A1C
Est. average glucose Bld gHb Est-mCnc: 108 mg/dL
Hgb A1c MFr Bld: 5.4 % (ref 4.8–5.6)

## 2020-07-31 ENCOUNTER — Other Ambulatory Visit: Payer: Self-pay | Admitting: Physician Assistant

## 2020-07-31 DIAGNOSIS — R899 Unspecified abnormal finding in specimens from other organs, systems and tissues: Secondary | ICD-10-CM

## 2020-08-01 ENCOUNTER — Other Ambulatory Visit: Payer: Medicare HMO

## 2020-08-01 ENCOUNTER — Other Ambulatory Visit: Payer: Self-pay

## 2020-08-01 DIAGNOSIS — R899 Unspecified abnormal finding in specimens from other organs, systems and tissues: Secondary | ICD-10-CM | POA: Diagnosis not present

## 2020-08-02 LAB — B12 AND FOLATE PANEL
Folate: >20 ng/mL (ref >3.0)
Vitamin B-12: 378 pg/mL (ref 232–1245)

## 2020-08-02 LAB — COMPREHENSIVE METABOLIC PANEL
ALT: 15 IU/L (ref 0–32)
AST: 20 IU/L (ref 0–40)
Albumin/Globulin Ratio: 1.4 (ref 1.2–2.2)
Albumin: 4.2 g/dL (ref 3.7–4.7)
Alkaline Phosphatase: 86 IU/L (ref 44–121)
BUN/Creatinine Ratio: 13 (ref 12–28)
BUN: 12 mg/dL (ref 8–27)
Bilirubin Total: 0.3 mg/dL (ref 0.0–1.2)
CO2: 19 mmol/L — ABNORMAL LOW (ref 20–29)
Calcium: 9.7 mg/dL (ref 8.7–10.3)
Chloride: 105 mmol/L (ref 96–106)
Creatinine, Ser: 0.96 mg/dL (ref 0.57–1.00)
Globulin, Total: 3 g/dL (ref 1.5–4.5)
Glucose: 96 mg/dL (ref 65–99)
Potassium: 4.5 mmol/L (ref 3.5–5.2)
Sodium: 144 mmol/L (ref 134–144)
Total Protein: 7.2 g/dL (ref 6.0–8.5)
eGFR: 63 mL/min/{1.73_m2} (ref >59)

## 2020-08-25 ENCOUNTER — Encounter: Payer: Self-pay | Admitting: Physician Assistant

## 2020-08-25 ENCOUNTER — Ambulatory Visit (INDEPENDENT_AMBULATORY_CARE_PROVIDER_SITE_OTHER): Payer: Medicare HMO | Admitting: Physician Assistant

## 2020-08-25 ENCOUNTER — Other Ambulatory Visit: Payer: Self-pay

## 2020-08-25 VITALS — BP 122/80 | HR 78 | Temp 96.6°F | Ht 65.0 in | Wt 151.2 lb

## 2020-08-25 DIAGNOSIS — R634 Abnormal weight loss: Secondary | ICD-10-CM | POA: Insufficient documentation

## 2020-08-25 DIAGNOSIS — I10 Essential (primary) hypertension: Secondary | ICD-10-CM

## 2020-08-25 NOTE — Progress Notes (Signed)
Acute Office Visit  Subjective:    Patient ID: Courtney Levine, female    DOB: 1951/03/24, 70 y.o.   MRN: [id number]  Chief Complaint  Patient presents with  . Weight Loss    4 Week follow up    HPI Patient is in today for follow up on weight - since last visit she has gained 2 pounds - she states she is eating well and feels the best she has felt in a long time I told her I was still concerned about the amount of weight loss she has had in a short amount of time and recommend further testing (including chest xray,mammogram,colonoscopy) and pt still refuses despite she does have a strong history of smoking She states all she wants checked is her blood pressure and cholesterol and that if 'anything else is wrong she does not want to know'  Past Medical History:  Diagnosis Date  . COPD (chronic obstructive pulmonary disease) (HCC)   . Hypercholesteremia   . Hypertension     Past Surgical History:  Procedure Laterality Date  . CATARACT EXTRACTION     2020    Family History  Problem Relation Age of Onset  . Pulmonary embolism Mother     Social History   Socioeconomic History  . Marital status: Single    Spouse name: Not on file  . Number of children: Not on file  . Years of education: Not on file  . Highest education level: Not on file  Occupational History  . Occupation: [employer]   part time  Tobacco Use  . Smoking status: Current Every Day Smoker    Packs/day: 0.50    Types: Cigarettes  . Smokeless tobacco: Never Used  Vaping Use  . Vaping Use: Never used  Substance and Sexual Activity  . Alcohol use: No  . Drug use: No  . Sexual activity: Not Currently  Other Topics Concern  . Not on file  Social History Narrative  . Not on file   Social Determinants of Health   Financial Resource Strain: Not on file  Food Insecurity: Not on file  Transportation Needs: Not on file  Physical Activity: Not on file  Stress: Not on file  Social  Connections: Not on file  Intimate Partner Violence: Not on file    Outpatient Medications Prior to Visit  Medication Sig Dispense Refill  . albuterol (PROVENTIL HFA;VENTOLIN HFA) 108 (90 Base) MCG/ACT inhaler Inhale 2 puffs into the lungs every 4 (four) hours as needed for wheezing or shortness of breath.    . budesonide-formoterol (SYMBICORT) 160-4.5 MCG/ACT inhaler Inhale 2 puffs into the lungs 2 (two) times daily. 11 g 3  . lisinopril (ZESTRIL) 20 MG tablet Take 1 tablet (20 mg total) by mouth daily. 90 tablet 1  . lovastatin (MEVACOR) 20 MG tablet Take 1 tablet (20 mg total) by mouth at bedtime. 90 tablet 1  . amoxicillin (AMOXIL) 875 MG tablet Take 1 tablet (875 mg total) by mouth 2 (two) times daily. 20 tablet 0   No facility-administered medications prior to visit.    No Known Allergies  Review of Systems CONSTITUTIONAL: see HPI E/N/T: Negative for ear pain, nasal congestion and sore throat.  CARDIOVASCULAR: Negative for chest pain, dizziness, palpitations and pedal edema.  RESPIRATORY: has chronic cough because of smoking and uses inhalers - once again refuses further testing (chest xray or chest CT) GASTROINTESTINAL: Negative for abdominal pain, acid reflux symptoms, constipation, diarrhea, nausea and vomiting.  MSK:  Negative for arthralgias and myalgias.  INTEGUMENTARY: Negative for rash.  PSYCHIATRIC: Negative for sleep disturbance and to question depression screen.  Negative for depression, negative for anhedonia.         Objective:    Physical Exam PHYSICAL EXAM:   VS: BP 122/80 (BP Location: Left Arm, Patient Position: Sitting, Cuff Size: Normal)   Pulse 78   Temp (!) 96.6 F (35.9 C) (Temporal)   Ht 5\' 5"  (1.651 m)   Wt 151 lb 3.2 oz (68.6 kg)   SpO2 99%   BMI 25.16 kg/m   GEN: Well nourished, well developed, in no acute distress  HEENT: normal external ears and nose - normal external auditory canals and TMS -  - Lips, Teeth and Gums - normal  Oropharynx  - normal mucosa, palate, and posterior pharynx Cardiac: RRR; no murmurs, rubs, or gallops,no edema - no significant varicosities Respiratory:  normal respiratory rate and pattern with no distress -mild scattered rhonchi noted GI: normal bowel sounds, no masses or tenderness Skin: warm and dry, no rash  Neuro:  Alert and Oriented x 3,- CN II-Xii grossly intact Psych: euthymic mood, appropriate affect and demeanor  BP 122/80 (BP Location: Left Arm, Patient Position: Sitting, Cuff Size: Normal)   Pulse 78   Temp (!) 96.6 F (35.9 C) (Temporal)   Ht 5\' 5"  (1.651 m)   Wt 151 lb 3.2 oz (68.6 kg)   SpO2 99%   BMI 25.16 kg/m  Wt Readings from Last 3 Encounters:  08/25/20 151 lb 3.2 oz (68.6 kg)  07/28/20 149 lb 6.4 oz (67.8 kg)  01/27/20 179 lb (81.2 kg)    There are no preventive care reminders to display for this patient.  There are no preventive care reminders to display for this patient.   Lab Results  Component Value Date   TSH 2.660 07/28/2020   Lab Results  Component Value Date   WBC 8.6 07/28/2020   HGB 13.3 07/28/2020   HCT 35.9 07/28/2020   MCV 104 (H) 07/28/2020   PLT 177 07/28/2020   Lab Results  Component Value Date   NA 144 08/01/2020   K 4.5 08/01/2020   CO2 19 (L) 08/01/2020   GLUCOSE 96 08/01/2020   BUN 12 08/01/2020   CREATININE 0.96 08/01/2020   BILITOT 0.3 08/01/2020   ALKPHOS 86 08/01/2020   AST 20 08/01/2020   ALT 15 08/01/2020   PROT 7.2 08/01/2020   ALBUMIN 4.2 08/01/2020   CALCIUM 9.7 08/01/2020   ANIONGAP 7 02/06/2017   Lab Results  Component Value Date   CHOL 183 07/28/2020   Lab Results  Component Value Date   HDL 56 07/28/2020   Lab Results  Component Value Date   LDLCALC 113 (H) 07/28/2020   Lab Results  Component Value Date   TRIG 74 07/28/2020   Lab Results  Component Value Date   CHOLHDL 3.3 07/28/2020   Lab Results  Component Value Date   HGBA1C 5.4 07/28/2020       Assessment & Plan:  1. Weight  loss Recommend healthy diet Supplement with protein shakes 2. Benign hypertension  continue meds as directed  No orders of the defined types were placed in this encounter.   No orders of the defined types were placed in this encounter.    Follow-up: Return in about 5 months (around 01/25/2021) for chronic fasting and 1 month for weight check.  An After Visit Summary was printed and given to the patient.  SARA R  Kathie Rhodes [facility] [phone]

## 2020-09-06 ENCOUNTER — Telehealth: Payer: Self-pay

## 2020-09-06 NOTE — Telephone Encounter (Signed)
Patient came in today for a 1 month weight check. Weighed 152.4 lbs.

## 2020-09-28 DIAGNOSIS — H5203 Hypermetropia, bilateral: Secondary | ICD-10-CM | POA: Diagnosis not present

## 2020-11-15 ENCOUNTER — Other Ambulatory Visit: Payer: Self-pay | Admitting: Physician Assistant

## 2020-11-15 DIAGNOSIS — J060 Acute laryngopharyngitis: Secondary | ICD-10-CM

## 2021-01-25 ENCOUNTER — Ambulatory Visit (INDEPENDENT_AMBULATORY_CARE_PROVIDER_SITE_OTHER): Payer: Medicare HMO | Admitting: Physician Assistant

## 2021-01-25 ENCOUNTER — Other Ambulatory Visit: Payer: Self-pay

## 2021-01-25 ENCOUNTER — Encounter: Payer: Self-pay | Admitting: Physician Assistant

## 2021-01-25 VITALS — BP 108/74 | HR 82 | Temp 97.3°F | Ht 65.0 in | Wt 147.4 lb

## 2021-01-25 DIAGNOSIS — E782 Mixed hyperlipidemia: Secondary | ICD-10-CM | POA: Diagnosis not present

## 2021-01-25 DIAGNOSIS — Z23 Encounter for immunization: Secondary | ICD-10-CM | POA: Diagnosis not present

## 2021-01-25 DIAGNOSIS — J449 Chronic obstructive pulmonary disease, unspecified: Secondary | ICD-10-CM | POA: Diagnosis not present

## 2021-01-25 DIAGNOSIS — I10 Essential (primary) hypertension: Secondary | ICD-10-CM

## 2021-01-25 DIAGNOSIS — R7989 Other specified abnormal findings of blood chemistry: Secondary | ICD-10-CM | POA: Diagnosis not present

## 2021-01-25 NOTE — Progress Notes (Signed)
Established Patient Office Visit  Subjective:  Patient ID: Courtney Levine, female    DOB: Jun 15, 1950  Age: 70 y.o. MRN: [id number]  CC:  Chief Complaint  Patient presents with   5 month fasting    HPI DONYEL CASTAGNOLA presents for follow up of COPD, hypertension and hyperlipidemia  Pt taking symbicort and albuterol as directed for COPD = states symptoms are stable -- have spoken before about imaging and pt refuses  Mixed hyperlipidemia  Pt presents with hyperlipidemia. . Compliance with treatment has been good - she is currently on lovastatin 43m qd  Pt presents for follow up of hypertension.  The patient is tolerating the medication well without side effects. Compliance with treatment has been good; including taking medication as directed ,  , and following up as directed. .- pt currently taking lisinopril 276mqd  Pt would like flu shot today  Past Medical History:  Diagnosis Date   [place]   Hypercholesteremia    Hypertension     Past Surgical History:  Procedure Laterality Date   CATARACT EXTRACTION     2020    Family History  Problem Relation Age of Onset   Pulmonary embolism Mother     Social History   Socioeconomic History   Marital status: Single    Spouse name: Not on file   Number of children: Not on file   Years of education: Not on file   Highest education level: Not on file  Occupational History   Occupation: [employer]   part time  Tobacco Use   Smoking status: Every Day    Packs/day: 0.50    Types: Cigarettes   Smokeless tobacco: Never  Vaping Use   Vaping Use: Never used  Substance and Sexual Activity   Alcohol use: No   Drug use: No   Sexual activity: Not Currently  Other Topics Concern   Not on file  Social History Narrative   Not on file   Social Determinants of Health   Financial Resource Strain: Not on file  Food Insecurity: Not on file  Transportation Needs: Not on  file  Physical Activity: Not on file  Stress: Not on file  Social Connections: Not on file  Intimate Partner Violence: Not on file     Current Outpatient Medications:    albuterol (PROVENTIL HFA;VENTOLIN HFA) 108 (90 Base) MCG/ACT inhaler, Inhale 2 puffs into the lungs every 4 (four) hours as needed for wheezing or shortness of breath., Disp: , Rfl:    lisinopril (ZESTRIL) 20 MG tablet, Take 1 tablet (20 mg total) by mouth daily., Disp: 90 tablet, Rfl: 1   lovastatin (MEVACOR) 20 MG tablet, Take 1 tablet (20 mg total) by mouth at bedtime., Disp: 90 tablet, Rfl: 1   SYMBICORT 160-4.5 MCG/ACT inhaler, Inhale 2 puffs by mouth twice daily, Disp: 33 g, Rfl: 0   No Known Allergies  CONSTITUTIONAL: Negative for chills, fatigue, fever, unintentional weight gain and unintentional weight loss. - weight stable since last visit E/N/T: Negative for ear pain, nasal congestion and sore throat.  CARDIOVASCULAR: Negative for chest pain, dizziness, palpitations and pedal edema.  RESPIRATORY: Negative for recent cough and dyspnea.  GASTROINTESTINAL: Negative for abdominal pain, acid reflux symptoms, constipation, diarrhea, nausea and vomiting.  MSK: Negative for arthralgias and myalgias.  INTEGUMENTARY: Negative for rash.  NEUROLOGICAL: Negative for dizziness and headaches.  PSYCHIATRIC: Negative for sleep disturbance and to question depression screen.  Negative for depression,  negative for anhedonia.        Objective:   PHYSICAL EXAM:   VS: BP 108/74   Pulse 82   Temp (!) 97.3 F (36.3 C)   Ht _0  (1.651 m)   Wt 147 lb 6.4 oz (66.9 kg)   SpO2 98%   BMI 24.53 kg/m   GEN: Well nourished, well developed, in no acute distress  Cardiac: RRR; no murmurs, rubs, or gallops,no edema -  Respiratory:  normal respiratory rate and pattern with no distress - normal breath sounds with no rales, rhonchi, wheezes or rubs GI: normal bowel sounds, no masses or tenderness MS: no deformity or atrophy   Skin: warm and dry, no rash  Psych: euthymic mood, appropriate affect and demeanor   Health Maintenance Due  Topic Date Due   INFLUENZA VACCINE  12/25/2020    There are no preventive care reminders to display for this patient.  Lab Results  Component Value Date   TSH 2.660 07/28/2020   Lab Results  Component Value Date   WBC 8.6 07/28/2020   HGB 13.3 07/28/2020   HCT 35.9 07/28/2020   MCV 104 (H) 07/28/2020   PLT 177 07/28/2020   Lab Results  Component Value Date   NA 144 08/01/2020   K 4.5 08/01/2020   CO2 19 (L) 08/01/2020   GLUCOSE 96 08/01/2020   BUN 12 08/01/2020   CREATININE 0.96 08/01/2020   BILITOT 0.3 08/01/2020   ALKPHOS 86 08/01/2020   AST 20 08/01/2020   ALT 15 08/01/2020   PROT 7.2 08/01/2020   ALBUMIN 4.2 08/01/2020   CALCIUM 9.7 08/01/2020   ANIONGAP 7 02/06/2017   EGFR 63 08/01/2020   Lab Results  Component Value Date   CHOL 183 07/28/2020   Lab Results  Component Value Date   HDL 56 07/28/2020   Lab Results  Component Value Date   LDLCALC 113 (H) 07/28/2020   Lab Results  Component Value Date   TRIG 74 07/28/2020   Lab Results  Component Value Date   CHOLHDL 3.3 07/28/2020   Lab Results  Component Value Date   HGBA1C 5.4 07/28/2020      Assessment & Plan:   Problem List Items Addressed This Visit       Cardiovascular and Mediastinum   Benign hypertension - Primary   Relevant Orders   CBC with Differential/Platelet   Comprehensive metabolic panel   TSH Continue current meds     Respiratory   Chronic obstructive pulmonary disease ([place]) Continue meds Recommend to stop smoking     Other   Mixed hyperlipidemia   Relevant Orders   Lipid panel Continue meds   Need for prophylactic vaccination and inoculation against influenza   Relevant Orders   Flu Vaccine QUAD High Dose(Fluad)    No orders of the defined types were placed in this encounter.   Follow-up: Return in about 6 months (around 07/25/2021) for  chronic fasting follow up.    SARA R Kendrah Lovern, PA-C

## 2021-01-26 ENCOUNTER — Other Ambulatory Visit: Payer: Self-pay | Admitting: Physician Assistant

## 2021-01-26 DIAGNOSIS — R899 Unspecified abnormal finding in specimens from other organs, systems and tissues: Secondary | ICD-10-CM

## 2021-01-26 LAB — COMPREHENSIVE METABOLIC PANEL
ALT: 15 IU/L (ref 0–32)
AST: 19 IU/L (ref 0–40)
Albumin/Globulin Ratio: 1.3 (ref 1.2–2.2)
Albumin: 3.9 g/dL (ref 3.7–4.7)
Alkaline Phosphatase: 91 IU/L (ref 44–121)
BUN/Creatinine Ratio: 11 — ABNORMAL LOW (ref 12–28)
BUN: 10 mg/dL (ref 8–27)
Bilirubin Total: 0.3 mg/dL (ref 0.0–1.2)
CO2: 20 mmol/L (ref 20–29)
Calcium: 9.6 mg/dL (ref 8.7–10.3)
Chloride: 104 mmol/L (ref 96–106)
Creatinine, Ser: 0.88 mg/dL (ref 0.57–1.00)
Globulin, Total: 2.9 g/dL (ref 1.5–4.5)
Glucose: 94 mg/dL (ref 65–99)
Potassium: 4.3 mmol/L (ref 3.5–5.2)
Sodium: 140 mmol/L (ref 134–144)
Total Protein: 6.8 g/dL (ref 6.0–8.5)
eGFR: 70 mL/min/{1.73_m2} (ref >59)

## 2021-01-26 LAB — CBC WITH DIFFERENTIAL/PLATELET
Basophils Absolute: 0.1 10*3/uL (ref 0.0–0.2)
Basos: 1 %
EOS (ABSOLUTE): 0.3 10*3/uL (ref 0.0–0.4)
Eos: 4 %
Hematocrit: 35.4 % (ref 34.0–46.6)
Hemoglobin: 12.9 g/dL (ref 11.1–15.9)
Immature Grans (Abs): 0 10*3/uL (ref 0.0–0.1)
Immature Granulocytes: 0 %
Lymphocytes Absolute: 3.1 10*3/uL (ref 0.7–3.1)
Lymphs: 39 %
MCH: 37.8 pg — ABNORMAL HIGH (ref 26.6–33.0)
MCHC: 36.4 g/dL — ABNORMAL HIGH (ref 31.5–35.7)
MCV: 104 fL — ABNORMAL HIGH (ref 79–97)
Monocytes Absolute: 0.5 10*3/uL (ref 0.1–0.9)
Monocytes: 7 %
Neutrophils Absolute: 3.9 10*3/uL (ref 1.4–7.0)
Neutrophils: 49 %
Platelets: 175 10*3/uL (ref 150–450)
RBC: 3.41 x10E6/uL — ABNORMAL LOW (ref 3.77–5.28)
RDW: 13.1 % (ref 11.7–15.4)
WBC: 7.9 10*3/uL (ref 3.4–10.8)

## 2021-01-26 LAB — LIPID PANEL
Chol/HDL Ratio: 2.9 ratio (ref 0.0–4.4)
Cholesterol, Total: 175 mg/dL (ref 100–199)
HDL: 61 mg/dL (ref >39)
LDL Chol Calc (NIH): 99 mg/dL (ref 0–99)
Triglycerides: 79 mg/dL (ref 0–149)
VLDL Cholesterol Cal: 15 mg/dL (ref 5–40)

## 2021-01-26 LAB — CARDIOVASCULAR RISK ASSESSMENT

## 2021-01-26 LAB — TSH: TSH: 1.73 u[IU]/mL (ref 0.450–4.500)

## 2021-01-31 LAB — METHYLMALONIC ACID, SERUM: Methylmalonic Acid: 138 nmol/L (ref 0–378)

## 2021-01-31 LAB — SPECIMEN STATUS REPORT

## 2021-02-16 ENCOUNTER — Other Ambulatory Visit: Payer: Self-pay | Admitting: Physician Assistant

## 2021-02-16 DIAGNOSIS — I10 Essential (primary) hypertension: Secondary | ICD-10-CM

## 2021-02-16 DIAGNOSIS — E782 Mixed hyperlipidemia: Secondary | ICD-10-CM

## 2021-05-19 ENCOUNTER — Other Ambulatory Visit: Payer: Self-pay | Admitting: Physician Assistant

## 2021-05-19 DIAGNOSIS — E782 Mixed hyperlipidemia: Secondary | ICD-10-CM

## 2021-05-19 DIAGNOSIS — I10 Essential (primary) hypertension: Secondary | ICD-10-CM

## 2021-05-19 DIAGNOSIS — J060 Acute laryngopharyngitis: Secondary | ICD-10-CM

## 2021-05-23 ENCOUNTER — Other Ambulatory Visit: Payer: Self-pay | Admitting: Physician Assistant

## 2021-05-23 DIAGNOSIS — I10 Essential (primary) hypertension: Secondary | ICD-10-CM

## 2021-05-23 DIAGNOSIS — E782 Mixed hyperlipidemia: Secondary | ICD-10-CM

## 2021-07-25 ENCOUNTER — Encounter: Payer: Self-pay | Admitting: Physician Assistant

## 2021-07-25 ENCOUNTER — Other Ambulatory Visit: Payer: Self-pay

## 2021-07-25 ENCOUNTER — Ambulatory Visit (INDEPENDENT_AMBULATORY_CARE_PROVIDER_SITE_OTHER): Payer: Medicare HMO | Admitting: Physician Assistant

## 2021-07-25 VITALS — BP 124/68 | HR 87 | Temp 96.6°F | Ht 65.0 in | Wt 152.2 lb

## 2021-07-25 DIAGNOSIS — R899 Unspecified abnormal finding in specimens from other organs, systems and tissues: Secondary | ICD-10-CM | POA: Diagnosis not present

## 2021-07-25 DIAGNOSIS — E782 Mixed hyperlipidemia: Secondary | ICD-10-CM

## 2021-07-25 DIAGNOSIS — I10 Essential (primary) hypertension: Secondary | ICD-10-CM

## 2021-07-25 NOTE — Progress Notes (Signed)
? ?Subjective:  ?Patient ID: Courtney Levine, female    DOB: [birth year]  Age: 71 y.o. MRN: [id number] ? ?Chief Complaint  ?Patient presents with  ? Hypertension  ? ? ?HPI ? Pt presents for follow up of hypertension. The patient is tolerating the medication well without side effects. Compliance with treatment has been good; including taking medication as directed , maintains a healthy diet and following up as directed. Currently taking zestril 20mg  qd ? ?Mixed hyperlipidemia  ?Pt presents with hyperlipidemia. The patient is compliant with medications, maintains a low cholesterol diet , follows up as directed ,The patient denies experiencing any hypercholesterolemia related symptoms. Currently taking mevacor 20mg  qd ? ?Pt with history of COPD - stable on albuterol and symbicort ? ? ?Current Outpatient Medications on File Prior to Visit  ?Medication Sig Dispense Refill  ? albuterol (PROVENTIL HFA;VENTOLIN HFA) 108 (90 Base) MCG/ACT inhaler Inhale 2 puffs into the lungs every 4 (four) hours as needed for wheezing or shortness of breath.    ? lisinopril (ZESTRIL) 20 MG tablet Take 1 tablet by mouth once daily 90 tablet 0  ? lovastatin (MEVACOR) 20 MG tablet TAKE 1 TABLET BY MOUTH AT BEDTIME 90 tablet 0  ? SYMBICORT 160-4.5 MCG/ACT inhaler Inhale 2 puffs by mouth twice daily 33 g 0  ? ?No current facility-administered medications on file prior to visit.  ? ?Past Medical History:  ?Diagnosis Date  ? COPD (chronic obstructive pulmonary disease) (HCC)   ? Hypercholesteremia   ? Hypertension   ? ?Past Surgical History:  ?Procedure Laterality Date  ? CATARACT EXTRACTION    ? 2020  ?  ?Family History  ?Problem Relation Age of Onset  ? Pulmonary embolism Mother   ? ?Social History  ? ?Socioeconomic History  ? Marital status: Single  ?  Spouse name: Not on file  ? Number of children: Not on file  ? Years of education: Not on file  ? Highest education level: Not on file  ?Occupational History  ? Occupation: [employer]    part time  ?Tobacco Use  ? Smoking status: Every Day  ?  Packs/day: 0.50  ?  Types: Cigarettes  ? Smokeless tobacco: Never  ?Vaping Use  ? Vaping Use: Never used  ?Substance and Sexual Activity  ? Alcohol use: No  ? Drug use: No  ? Sexual activity: Not Currently  ?Other Topics Concern  ? Not on file  ?Social History Narrative  ? Not on file  ? ?Social Determinants of Health  ? ?Financial Resource Strain: Not on file  ?Food Insecurity: Not on file  ?Transportation Needs: Not on file  ?Physical Activity: Not on file  ?Stress: Not on file  ?Social Connections: Not on file  ? ? ?Review of Systems ?CONSTITUTIONAL: Negative for chills, fatigue, fever, unintentional weight gain and unintentional weight loss.  ?E/N/T: Negative for ear pain, nasal congestion and sore throat.  ?CARDIOVASCULAR: Negative for chest pain, dizziness, palpitations and pedal edema.  ?RESPIRATORY: Negative for recent cough and dyspnea.  ?GASTROINTESTINAL: Negative for abdominal pain, acid reflux symptoms, constipation, diarrhea, nausea and vomiting.  ?MSK: Negative for arthralgias and myalgias.  ?INTEGUMENTARY: Negative for rash.  ?NEUROLOGICAL: Negative for dizziness and headaches.  ?PSYCHIATRIC: Negative for sleep disturbance and to question depression screen.  Negative for depression, negative for anhedonia.  ?   ? ? ?Objective:  ?PHYSICAL EXAM:  ? ?VS: BP 124/68 (BP Location: Left Arm, Patient Position: Sitting, Cuff Size: Normal)   Pulse 87   Temp )  96.6 ?F (35.9 ?C) (Temporal)   Ht 5\' 5"  (1.651 m)   Wt 152 lb 3.2 oz (69 kg)   SpO2 95%   BMI 25.33 kg/m?  ? ?GEN: Well nourished, well developed, in no acute distress  ?Cardiac: RRR; no murmurs, rubs, or gallops,no edema - ?Respiratory:  normal respiratory rate and pattern with no distress - normal breath sounds with no rales, rhonchi, wheezes or rubs ? ?MS: no deformity or atrophy  ? ?Psych: euthymic mood, appropriate affect and demeanor ? ? ?Lab Results  ?Component Value Date  ? WBC 7.9  01/25/2021  ? HGB 12.9 01/25/2021  ? HCT 35.4 01/25/2021  ? PLT 175 01/25/2021  ? GLUCOSE 94 01/25/2021  ? CHOL 175 01/25/2021  ? TRIG 79 01/25/2021  ? HDL 61 01/25/2021  ? LDLCALC 99 01/25/2021  ? ALT 15 01/25/2021  ? AST 19 01/25/2021  ? NA 140 01/25/2021  ? K 4.3 01/25/2021  ? CL 104 01/25/2021  ? CREATININE 0.88 01/25/2021  ? BUN 10 01/25/2021  ? CO2 20 01/25/2021  ? TSH 1.730 01/25/2021  ? HGBA1C 5.4 07/28/2020  ? ? ? ? ?Assessment & Plan:  ? ?Problem List Items Addressed This Visit   ? ?  ? Cardiovascular and Mediastinum  ? Benign hypertension - Primary  ? Relevant Orders  ? CBC with Differential/Platelet  ? Comprehensive metabolic panel  ? Lipid panel  ?  ? Other  ? Mixed hyperlipidemia  ? Relevant Orders  ? CBC with Differential/Platelet  ? Comprehensive metabolic panel  ? TSH  ? Lipid panel  ? ?Other Visit Diagnoses   ? ? Abnormal laboratory test result      ? Relevant Orders  ? TSH  ? ?  ?. ? ?No orders of the defined types were placed in this encounter. ? ? ?Orders Placed This Encounter  ?Procedures  ? CBC with Differential/Platelet  ? Comprehensive metabolic panel  ? TSH  ? Lipid panel  ?  ? ?Follow-up: Return in about 6 months (around 01/25/2022) for chronic fasting follow up. ? ?An After Visit Summary was printed and given to the patient. ? ?SARA R Yordan Martindale, PA-C ?[facility] ?[phone] ?

## 2021-07-26 ENCOUNTER — Other Ambulatory Visit: Payer: Self-pay | Admitting: Physician Assistant

## 2021-07-26 DIAGNOSIS — R899 Unspecified abnormal finding in specimens from other organs, systems and tissues: Secondary | ICD-10-CM

## 2021-07-26 LAB — CBC WITH DIFFERENTIAL/PLATELET
Basophils Absolute: 0.1 10*3/uL (ref 0.0–0.2)
Basos: 1 %
EOS (ABSOLUTE): 0.4 10*3/uL (ref 0.0–0.4)
Eos: 5 %
Hematocrit: 36.6 % (ref 34.0–46.6)
Hemoglobin: 13.6 g/dL (ref 11.1–15.9)
Immature Grans (Abs): 0 10*3/uL (ref 0.0–0.1)
Immature Granulocytes: 0 %
Lymphocytes Absolute: 3.1 10*3/uL (ref 0.7–3.1)
Lymphs: 38 %
MCH: 38.7 pg — ABNORMAL HIGH (ref 26.6–33.0)
MCHC: 37.2 g/dL — ABNORMAL HIGH (ref 31.5–35.7)
MCV: 104 fL — ABNORMAL HIGH (ref 79–97)
Monocytes Absolute: 0.6 10*3/uL (ref 0.1–0.9)
Monocytes: 7 %
Neutrophils Absolute: 4 10*3/uL (ref 1.4–7.0)
Neutrophils: 49 %
Platelets: 181 10*3/uL (ref 150–450)
RBC: 3.51 x10E6/uL — ABNORMAL LOW (ref 3.77–5.28)
RDW: 12.7 % (ref 11.7–15.4)
WBC: 8.1 10*3/uL (ref 3.4–10.8)

## 2021-07-26 LAB — COMPREHENSIVE METABOLIC PANEL
ALT: 18 IU/L (ref 0–32)
AST: 24 IU/L (ref 0–40)
Albumin/Globulin Ratio: 1.4 (ref 1.2–2.2)
Albumin: 4.1 g/dL (ref 3.7–4.7)
Alkaline Phosphatase: 88 IU/L (ref 44–121)
BUN/Creatinine Ratio: 10 — ABNORMAL LOW (ref 12–28)
BUN: 9 mg/dL (ref 8–27)
Bilirubin Total: 0.3 mg/dL (ref 0.0–1.2)
CO2: 21 mmol/L (ref 20–29)
Calcium: 9.7 mg/dL (ref 8.7–10.3)
Chloride: 108 mmol/L — ABNORMAL HIGH (ref 96–106)
Creatinine, Ser: 0.88 mg/dL (ref 0.57–1.00)
Globulin, Total: 3 g/dL (ref 1.5–4.5)
Glucose: 97 mg/dL (ref 70–99)
Potassium: 4.2 mmol/L (ref 3.5–5.2)
Sodium: 146 mmol/L — ABNORMAL HIGH (ref 134–144)
Total Protein: 7.1 g/dL (ref 6.0–8.5)
eGFR: 70 mL/min/{1.73_m2} (ref >59)

## 2021-07-26 LAB — CARDIOVASCULAR RISK ASSESSMENT

## 2021-07-26 LAB — LIPID PANEL
Chol/HDL Ratio: 3.3 ratio (ref 0.0–4.4)
Cholesterol, Total: 194 mg/dL (ref 100–199)
HDL: 59 mg/dL (ref >39)
LDL Chol Calc (NIH): 115 mg/dL — ABNORMAL HIGH (ref 0–99)
Triglycerides: 112 mg/dL (ref 0–149)
VLDL Cholesterol Cal: 20 mg/dL (ref 5–40)

## 2021-07-26 LAB — TSH: TSH: 2.4 u[IU]/mL (ref 0.450–4.500)

## 2021-07-27 LAB — SPECIMEN STATUS REPORT

## 2021-07-27 LAB — B12 AND FOLATE PANEL
Folate: >20 ng/mL (ref >3.0)
Vitamin B-12: 356 pg/mL (ref 232–1245)

## 2021-10-10 ENCOUNTER — Other Ambulatory Visit: Payer: Self-pay | Admitting: Physician Assistant

## 2021-10-10 DIAGNOSIS — J060 Acute laryngopharyngitis: Secondary | ICD-10-CM

## 2021-11-14 ENCOUNTER — Other Ambulatory Visit: Payer: Self-pay

## 2021-11-14 DIAGNOSIS — J060 Acute laryngopharyngitis: Secondary | ICD-10-CM

## 2021-11-14 DIAGNOSIS — I10 Essential (primary) hypertension: Secondary | ICD-10-CM

## 2021-11-14 DIAGNOSIS — E782 Mixed hyperlipidemia: Secondary | ICD-10-CM

## 2021-11-14 MED ORDER — SYMBICORT 160-4.5 MCG/ACT IN AERO: 2.0000 | INHALATION_SPRAY | Freq: Two times a day (BID) | RESPIRATORY_TRACT | 1 refills | Status: DC

## 2021-11-14 MED ORDER — LOVASTATIN 20 MG PO TABS: 20.0000 mg | ORAL_TABLET | Freq: Every day | ORAL | 0 refills | Status: DC

## 2021-11-14 MED ORDER — LISINOPRIL 20 MG PO TABS: 20.0000 mg | ORAL_TABLET | Freq: Every day | ORAL | 0 refills | Status: DC

## 2021-11-14 MED ORDER — ALBUTEROL SULFATE HFA 108 (90 BASE) MCG/ACT IN AERS
2.0000 | INHALATION_SPRAY | RESPIRATORY_TRACT | 3 refills | Status: DC | PRN
Start: 2021-11-14 — End: 2022-10-02

## 2022-01-18 ENCOUNTER — Other Ambulatory Visit: Payer: Self-pay | Admitting: Physician Assistant

## 2022-01-18 DIAGNOSIS — E782 Mixed hyperlipidemia: Secondary | ICD-10-CM

## 2022-01-25 ENCOUNTER — Ambulatory Visit: Payer: Medicare HMO | Admitting: Physician Assistant

## 2022-01-29 ENCOUNTER — Ambulatory Visit: Payer: Medicare HMO | Admitting: Physician Assistant

## 2022-02-19 ENCOUNTER — Encounter: Payer: Self-pay | Admitting: Physician Assistant

## 2022-02-19 ENCOUNTER — Ambulatory Visit (INDEPENDENT_AMBULATORY_CARE_PROVIDER_SITE_OTHER): Payer: Medicare HMO | Admitting: Physician Assistant

## 2022-02-19 VITALS — BP 122/78 | HR 80 | Temp 97.2°F | Ht 65.0 in | Wt 152.4 lb

## 2022-02-19 DIAGNOSIS — E559 Vitamin D deficiency, unspecified: Secondary | ICD-10-CM | POA: Insufficient documentation

## 2022-02-19 DIAGNOSIS — I10 Essential (primary) hypertension: Secondary | ICD-10-CM | POA: Diagnosis not present

## 2022-02-19 DIAGNOSIS — E782 Mixed hyperlipidemia: Secondary | ICD-10-CM | POA: Diagnosis not present

## 2022-02-19 DIAGNOSIS — J441 Chronic obstructive pulmonary disease with (acute) exacerbation: Secondary | ICD-10-CM | POA: Insufficient documentation

## 2022-02-19 DIAGNOSIS — Z23 Encounter for immunization: Secondary | ICD-10-CM

## 2022-02-19 MED ORDER — AZITHROMYCIN 250 MG PO TABS: ORAL_TABLET | ORAL | 0 refills | Status: AC

## 2022-02-19 MED ORDER — SYMBICORT 160-4.5 MCG/ACT IN AERO: 2.0000 | INHALATION_SPRAY | Freq: Two times a day (BID) | RESPIRATORY_TRACT | 1 refills | Status: DC

## 2022-02-19 NOTE — Progress Notes (Signed)
Subjective:  Patient ID: Courtney Levine, female    DOB: 1950-11-25  Age: 71 y.o. MRN: [id number]  Chief Complaint  Patient presents with   Hypertension    HPI  Pt presents for follow up of hypertension. The patient is tolerating the medication well without side effects. Compliance with treatment has been good; including taking medication as directed , maintains a healthy diet and regular exercise regimen , and following up as directed. Pt currently taking zestril 20mg  qd  Mixed hyperlipidemia  Pt presents with hyperlipidemia.  Compliance with treatment has been good The patient is compliant with medications, maintains a low cholesterol diet , follows up as directed , and maintains an exercise regimen . The patient denies experiencing any hypercholesterolemia related symptoms. She is taking mevacor 20mg  qd  Pt with history of COPD - today with productive cough Pt continues to smoke - states she does not want any type of preventive testing (xray, low dose chest CT --- says is something is wrong she doesn't want to know and has lived a long life to 43)  Pt would like flu shot today   Current Outpatient Medications on File Prior to Visit  Medication Sig Dispense Refill   albuterol (VENTOLIN HFA) 108 (90 Base) MCG/ACT inhaler Inhale 2 puffs into the lungs every 4 (four) hours as needed for wheezing or shortness of breath. 1 each 3   lisinopril (ZESTRIL) 20 MG tablet Take 1 tablet (20 mg total) by mouth daily. 90 tablet 0   lovastatin (MEVACOR) 20 MG tablet TAKE 1 TABLET BY MOUTH AT BEDTIME 90 tablet 0   No current facility-administered medications on file prior to visit.   Past Medical History:  Diagnosis Date   COPD (chronic obstructive pulmonary disease) ([place])    Hypercholesteremia    Hypertension    Past Surgical History:  Procedure Laterality Date   CATARACT EXTRACTION     2020    Family History  Problem Relation Age of Onset   Pulmonary embolism Mother    Social History    Socioeconomic History   Marital status: Single    Spouse name: Not on file   Number of children: Not on file   Years of education: Not on file   Highest education level: Not on file  Occupational History   Occupation: [employer]   part time  Tobacco Use   Smoking status: Every Day    Packs/day: 0.50    Types: Cigarettes   Smokeless tobacco: Never  Vaping Use   Vaping Use: Never used  Substance and Sexual Activity   Alcohol use: No   Drug use: No   Sexual activity: Not Currently  Other Topics Concern   Not on file  Social History Narrative   Not on file   Social Determinants of Health   Financial Resource Strain: Not on file  Food Insecurity: Not on file  Transportation Needs: Not on file  Physical Activity: Not on file  Stress: Not on file  Social Connections: Not on file    Review of Systems CONSTITUTIONAL: Negative for chills, fatigue, fever, unintentional weight gain and unintentional weight loss.  E/N/T: Negative for ear pain, nasal congestion and sore throat.  CARDIOVASCULAR: Negative for chest pain, dizziness, palpitations and pedal edema.  RESPIRATORY: see HPI GASTROINTESTINAL: Negative for abdominal pain, acid reflux symptoms, constipation, diarrhea, nausea and vomiting.  MSK: Negative for arthralgias and myalgias.  INTEGUMENTARY: Negative for rash.        Objective:  PHYSICAL  EXAM:   VS: BP 122/78 (BP Location: Left Arm, Patient Position: Sitting, Cuff Size: Normal)   Pulse 80   Temp (!) 97.2 F (36.2 C) (Temporal)   Ht 5\' 5"  (1.651 m)   Wt 152 lb 6.4 oz (69.1 kg)   SpO2 95%   BMI 25.36 kg/m   GEN: Well nourished, well developed, in no acute distress  Cardiac: RRR; no murmurs, rubs, or gallops,no edema - Respiratory:  scattered rhonchi and mild wheezes noted Skin: warm and dry, no rash   Psych: euthymic mood, appropriate affect and demeanor   Lab Results  Component Value Date   WBC 8.1 07/25/2021   HGB 13.6 07/25/2021    HCT 36.6 07/25/2021   PLT 181 07/25/2021   GLUCOSE 97 07/25/2021   CHOL 194 07/25/2021   TRIG 112 07/25/2021   HDL 59 07/25/2021   LDLCALC 115 (H) 07/25/2021   ALT 18 07/25/2021   AST 24 07/25/2021   NA 146 (H) 07/25/2021   K 4.2 07/25/2021   CL 108 (H) 07/25/2021   CREATININE 0.88 07/25/2021   BUN 9 07/25/2021   CO2 21 07/25/2021   TSH 2.400 07/25/2021   HGBA1C 5.4 07/28/2020      Assessment & Plan:   Problem List Items Addressed This Visit       Cardiovascular and Mediastinum   Benign hypertension - Primary   Relevant Orders   CBC with Differential/Platelet   Comprehensive metabolic panel Continue meds             Other   Mixed hyperlipidemia   Relevant Orders   Lipid panel   Other Visit Diagnoses     Vitamin D deficiency       Needs flu shot       Relevant Orders   Flu Vaccine QUAD High Dose(Fluad) (Completed)   COPD exacerbation (HCC)       Relevant Medications   SYMBICORT 160-4.5 MCG/ACT inhaler   azithromycin (ZITHROMAX) 250 MG tablet     .  Meds ordered this encounter  Medications   SYMBICORT 160-4.5 MCG/ACT inhaler    Sig: Inhale 2 puffs into the lungs 2 (two) times daily.    Dispense:  30.6 g    Refill:  1    3 inhalers/1 RF.    Order Specific Question:   Supervising Provider    Answer:   09/27/2020   azithromycin (ZITHROMAX) 250 MG tablet    Sig: Take 2 tablets on day 1, then 1 tablet daily on days 2 through 5    Dispense:  6 tablet    Refill:  0    Order Specific Question:   Supervising Provider    AnswerCorey Harold    Orders Placed This Encounter  Procedures   Flu Vaccine QUAD High Dose(Fluad)   CBC with Differential/Platelet   Comprehensive metabolic panel   Lipid panel     Follow-up: Return in about 6 months (around 08/20/2022) for chronic fasting follow up.  An After Visit Summary was printed and given to the patient.  08/22/2022 [facility] [phone]

## 2022-02-20 LAB — COMPREHENSIVE METABOLIC PANEL
ALT: 16 IU/L (ref 0–32)
AST: 20 IU/L (ref 0–40)
Albumin/Globulin Ratio: 1.4 (ref 1.2–2.2)
Albumin: 4 g/dL (ref 3.8–4.8)
Alkaline Phosphatase: 101 IU/L (ref 44–121)
BUN/Creatinine Ratio: 10 — ABNORMAL LOW (ref 12–28)
BUN: 9 mg/dL (ref 8–27)
Bilirubin Total: 0.3 mg/dL (ref 0.0–1.2)
CO2: 22 mmol/L (ref 20–29)
Calcium: 9.7 mg/dL (ref 8.7–10.3)
Chloride: 107 mmol/L — ABNORMAL HIGH (ref 96–106)
Creatinine, Ser: 0.87 mg/dL (ref 0.57–1.00)
Globulin, Total: 2.9 g/dL (ref 1.5–4.5)
Glucose: 104 mg/dL — ABNORMAL HIGH (ref 70–99)
Potassium: 4.7 mmol/L (ref 3.5–5.2)
Sodium: 144 mmol/L (ref 134–144)
Total Protein: 6.9 g/dL (ref 6.0–8.5)
eGFR: 70 mL/min/{1.73_m2} (ref >59)

## 2022-02-20 LAB — CBC WITH DIFFERENTIAL/PLATELET
Basophils Absolute: 0.1 10*3/uL (ref 0.0–0.2)
Basos: 1 %
EOS (ABSOLUTE): 0.3 10*3/uL (ref 0.0–0.4)
Eos: 3 %
Hematocrit: 35.8 % (ref 34.0–46.6)
Hemoglobin: 13.1 g/dL (ref 11.1–15.9)
Immature Grans (Abs): 0 10*3/uL (ref 0.0–0.1)
Immature Granulocytes: 0 %
Lymphocytes Absolute: 3.6 10*3/uL — ABNORMAL HIGH (ref 0.7–3.1)
Lymphs: 39 %
MCH: 37.5 pg — ABNORMAL HIGH (ref 26.6–33.0)
MCHC: 36.6 g/dL — ABNORMAL HIGH (ref 31.5–35.7)
MCV: 103 fL — ABNORMAL HIGH (ref 79–97)
Monocytes Absolute: 0.6 10*3/uL (ref 0.1–0.9)
Monocytes: 7 %
Neutrophils Absolute: 4.7 10*3/uL (ref 1.4–7.0)
Neutrophils: 50 %
Platelets: 210 10*3/uL (ref 150–450)
RBC: 3.49 x10E6/uL — ABNORMAL LOW (ref 3.77–5.28)
RDW: 13.3 % (ref 11.7–15.4)
WBC: 9.3 10*3/uL (ref 3.4–10.8)

## 2022-02-20 LAB — LIPID PANEL
Chol/HDL Ratio: 3 ratio (ref 0.0–4.4)
Cholesterol, Total: 173 mg/dL (ref 100–199)
HDL: 58 mg/dL (ref >39)
LDL Chol Calc (NIH): 100 mg/dL — ABNORMAL HIGH (ref 0–99)
Triglycerides: 82 mg/dL (ref 0–149)
VLDL Cholesterol Cal: 15 mg/dL (ref 5–40)

## 2022-02-20 LAB — CARDIOVASCULAR RISK ASSESSMENT

## 2022-03-25 ENCOUNTER — Other Ambulatory Visit: Payer: Self-pay | Admitting: Physician Assistant

## 2022-03-25 DIAGNOSIS — I10 Essential (primary) hypertension: Secondary | ICD-10-CM

## 2022-04-25 ENCOUNTER — Other Ambulatory Visit: Payer: Self-pay | Admitting: Physician Assistant

## 2022-04-25 DIAGNOSIS — E782 Mixed hyperlipidemia: Secondary | ICD-10-CM

## 2022-05-29 ENCOUNTER — Telehealth: Payer: Self-pay

## 2022-05-29 NOTE — Telephone Encounter (Signed)
Patient called and stated that she got a letter from her insurance and they stated they will not cover Symbicort anymore, but will cover these: BREO, gentric symbicort, and gentric adzair. Also she will need a 90 day supply of all medication on her next refill.

## 2022-05-29 NOTE — Telephone Encounter (Signed)
Does she need any refills of any meds at this time?

## 2022-06-03 NOTE — Telephone Encounter (Signed)
Patient does not need refill just wanted to make provider aware of the changes with insurance

## 2022-07-01 ENCOUNTER — Other Ambulatory Visit: Payer: Self-pay | Admitting: Physician Assistant

## 2022-07-01 DIAGNOSIS — I10 Essential (primary) hypertension: Secondary | ICD-10-CM

## 2022-07-17 ENCOUNTER — Ambulatory Visit: Payer: Medicare HMO

## 2022-07-17 DIAGNOSIS — Z0000 Encounter for general adult medical examination without abnormal findings: Secondary | ICD-10-CM

## 2022-07-17 NOTE — Progress Notes (Signed)
Subjective:   Courtney Levine is a 72 y.o. female who presents for Medicare Annual (Subsequent) preventive examination. I connected with  Courtney Levine on 07/17/22 by a audio enabled telemedicine application and verified that I am speaking with the correct person using two identifiers.  Patient Location: Home  Provider Location: Office/Clinic  I discussed the limitations of evaluation and management by telemedicine. The patient expressed understanding and agreed to proceed.  Cardiac Risk Factors include: advanced age (>67mn, >>84women);smoking/ tobacco exposure     Objective:    There were no vitals filed for this visit. There is no height or weight on file to calculate BMI.     02/06/2017    5:00 AM 02/04/2017    8:44 PM  Advanced Directives  Does Patient Have a Medical Advance Directive? No No  Would patient like information on creating a medical advance directive? No - Patient declined     Current Medications (verified) Outpatient Encounter Medications as of 07/17/2022  Medication Sig   lisinopril (ZESTRIL) 20 MG tablet TAKE 1 TABLET(20 MG) BY MOUTH DAILY   lovastatin (MEVACOR) 20 MG tablet TAKE 1 TABLET(20 MG) BY MOUTH AT BEDTIME   albuterol (VENTOLIN HFA) 108 (90 Base) MCG/ACT inhaler Inhale 2 puffs into the lungs every 4 (four) hours as needed for wheezing or shortness of breath.   SYMBICORT 160-4.5 MCG/ACT inhaler Inhale 2 puffs into the lungs 2 (two) times daily. (Patient not taking: Reported on 07/17/2022)   No facility-administered encounter medications on file as of 07/17/2022.    Allergies (verified) Patient has no known allergies.   History: Past Medical History:  Diagnosis Date   COPD [place]ch[place] obstructive [place] disease) [place]    Hypercholesteremia    Hypertension    Past Surgical History:  Procedure Laterality Date   CATARACT EXTRACTION     2020   Family History  Problem Relation Age of Onset   Pulmonary embolism Mother    Dementia Sister     Diabetes Brother    Social History   Socioeconomic History   Marital status: Single    Spouse name: Not on file   Number of children: 1   Years of education: Not on file   Highest education level: 10th grade  Occupational History   Occupation: [employer]   part time  Tobacco Use   Smoking status: Every Day    Packs/day: 0.50    Types: Cigarettes   Smokeless tobacco: Never  Vaping Use   Vaping Use: Never used  Substance and Sexual Activity   Alcohol use: Yes    Alcohol/week: 14.0 standard drinks of alcohol    Types: 14 Cans of beer per week    Comment: 1-2 beers daily   Drug use: No   Sexual activity: Not Currently  Other Topics Concern   Not on file  Social History Narrative   Not on file   Social Determinants of Health   Financial Resource Strain: Not on file  Food Insecurity: No Food Insecurity (07/17/2022)   Hunger Vital Sign    Worried About Running Out of Food in the Last Year: Never true    Ran Out of Food in the Last Year: Never true  Transportation Needs: No Transportation Needs (07/17/2022)   PRAPARE - THydrologist(Medical): No    Lack of Transportation (Non-Medical): No  Physical Activity: Inactive (07/17/2022)   Exercise Vital Sign    Days of Exercise per Week: 0 days  Minutes of Exercise per Session: 0 min  Stress: No Stress Concern Present (07/17/2022)   [place]    Feeling of Stress : Only a little  Social Connections: Not on file    Tobacco Counseling Ready to quit: No Counseling given: Not Answered   Clinical Intake:  Pre-visit preparation completed: Yes Pain : No/denies pain   BMI - recorded: 25.36 Nutritional Status: BMI 25 -29 Overweight Nutritional Risks: None Diabetes: No How often do you need to have someone help you when you read instructions, pamphlets, or other written materials from your doctor or pharmacy?: 2 -  Rarely What is the last grade level you completed in school?: 10th grade Interpreter Needed?: No    Activities of Daily Living    07/17/2022   10:08 AM  In your present state of health, do you have any difficulty performing the following activities:  Hearing? 0  Vision? 0  Difficulty concentrating or making decisions? 0  Walking or climbing stairs? 0  Dressing or bathing? 0  Doing errands, shopping? 0  Preparing Food and eating ? N  Using the Toilet? N  In the past six months, have you accidently leaked urine? N  Do you have problems with loss of bowel control? N  Managing your Medications? N  Managing your Finances? N  Housekeeping or managing your Housekeeping? N    Patient Care Team: Marge Duncans, Hershal Coria as PCP - General (Physician Assistant)     Assessment:   This is a routine wellness examination for [facility]  Hearing/Vision screen No results found.  Dietary issues and exercise activities discussed: Current Exercise Habits: The patient does not participate in regular exercise at present, Exercise limited by: None identified  Depression Screen    07/17/2022   10:16 AM 02/19/2022    9:26 AM 01/25/2021    9:20 AM 01/27/2020   10:22 AM  PHQ 2/9 Scores  PHQ - 2 Score 0 0 0 0  PHQ- 9 Score  0      Fall Risk    07/17/2022   10:22 AM 02/19/2022    9:25 AM 01/25/2021    9:20 AM 01/27/2020   10:22 AM  Fall Risk   Falls in the past year? 0 0 0 1  Number falls in past yr: 0 0 0 0  Injury with Fall? 0 0 0 1  Risk for fall due to : No Fall Risks No Fall Risks    Follow up Falls evaluation completed;Education provided Falls evaluation completed      [place]:  Any stairs in or around the home? No  If so, are there any without handrails? No  Home free of loose throw rugs in walkways, pet beds, electrical cords, etc? Yes  Adequate lighting in your home to reduce risk of falls? Yes   ASSISTIVE DEVICES UTILIZED TO PREVENT FALLS:  Life alert?  No  Use of a cane, walker or w/c? No  Grab bars in the bathroom? No  Shower chair or bench in shower? No  Elevated toilet seat or a handicapped toilet? No   Cognitive Function:        07/17/2022   10:23 AM  6CIT Screen  What Year? 0 points  What month? 0 points  What time? 0 points  Count back from 20 0 points  Months in reverse 0 points  Repeat phrase 0 points  Total Score 0 points    Immunizations  Immunization History  Administered Date(s) Administered   Fluad Quad(high Dose 65+) 03/09/2020, 01/25/2021, 02/19/2022   Influenza-Unspecified 02/25/2019   PFIZER(Purple Top)SARS-COV-2 Vaccination 11/04/2019, 11/26/2019, 06/01/2020   Pneumococcal Polysaccharide-23 02/06/2017    TDAP status: Due, Education has been provided regarding the importance of this vaccine. Advised may receive this vaccine at local pharmacy or Health Dept. Aware to provide a copy of the vaccination record if obtained from local pharmacy or Health Dept. Verbalized acceptance and understanding.  Flu Vaccine status: Up to date  Pneumococcal vaccine status: Declined,  Education has been provided regarding the importance of this vaccine but patient still declined. Advised may receive this vaccine at local pharmacy or Health Dept. Aware to provide a copy of the vaccination record if obtained from local pharmacy or Health Dept. Verbalized acceptance and understanding.   Covid-19 vaccine status: Declined, Education has been provided regarding the importance of this vaccine but patient still declined. Advised may receive this vaccine at local pharmacy or Health Dept.or vaccine clinic. Aware to provide a copy of the vaccination record if obtained from local pharmacy or Health Dept. Verbalized acceptance and understanding.  Qualifies for Shingles Vaccine? Yes   Zostavax completed No   Shingrix Completed?: No.    Education has been provided regarding the importance of this vaccine. Patient has been advised to call  insurance company to determine out of pocket expense if they have not yet received this vaccine. Advised may also receive vaccine at local pharmacy or Health Dept. Verbalized acceptance and understanding.  Screening Tests Health Maintenance  Topic Date Due   Medicare Annual Wellness (AWV)  Never done   INFLUENZA VACCINE  Completed   HPV VACCINES  Aged Out   DTaP/Tdap/Td  Discontinued   Pneumonia Vaccine 20+ Years old  Discontinued   MAMMOGRAM  Discontinued   DEXA SCAN  Discontinued   COLONOSCOPY (Pts 45-64yr Insurance coverage will need to be confirmed)  Discontinued   COVID-19 Vaccine  Discontinued   Hepatitis C Screening  Discontinued   Zoster Vaccines- Shingrix  Discontinued    Health Maintenance  Health Maintenance Due  Topic Date Due   Medicare Annual Wellness (AWV)  Never done    Colorectal cancer screening: No longer required.   Mammogram status: No longer required due to patient refusal.  Additional Screening:  Vision Screening: Recommended annual ophthalmology exams for early detection of glaucoma and other disorders of the eye. Is the patient up to date with their annual eye exam?  No  If pt is not established with a provider, would they like to be referred to a provider to establish care? No .   Dental Screening: Recommended annual dental exams for proper oral hygiene  Community Resource Referral / Chronic Care Management: CRR required this visit?  No   CCM required this visit?  No      Plan:    1- Patient declined all vaccines as well as all future colorectal cancer screenings, mammograms, and DEXAs. 2- Patient is a 1/2 pack per day smoker and has COPD.  She does not desire to quit smoking. 3- Exercise and healthy diet recommended.   I have personally reviewed and noted the following in the patient's chart:   Medical and social history Use of alcohol, tobacco or illicit drugs  Current medications and supplements including opioid prescriptions.  Patient is not currently taking opioid prescriptions. Functional ability and status Nutritional status Physical activity Advanced directives List of other physicians Hospitalizations, surgeries, and ER visits in previous 12 months  Vitals Screenings to include cognitive, depression, and falls Referrals and appointments  In addition, I have reviewed and discussed with patient certain preventive protocols, quality metrics, and best practice recommendations. A written personalized care plan for preventive services as well as general preventive health recommendations were provided to patient.     Erie Noe, LPN   [id number]

## 2022-07-17 NOTE — Patient Instructions (Signed)
Courtney Levine , Thank you for taking time to come for your Medicare Wellness Visit. I appreciate your ongoing commitment to your health goals. Please review the following plan we discussed and let me know if I can assist you in the future.   Screening recommendations/referrals: Colonoscopy: Declined Mammogram: Declined Bone Density: Declined Recommended yearly ophthalmology/optometry visit for glaucoma screening and checkup Recommended yearly dental visit for hygiene and checkup  Vaccinations: Influenza vaccine: up-to-date  Pneumococcal vaccine: Declined Tdap vaccine: Declined Shingles vaccine: Declined     Preventive Care 48 Years and Older, Female   Preventive care refers to lifestyle choices and visits with your health care provider that can promote health and wellness.  What does preventive care include? A yearly physical exam. This is also called an annual well check. Dental exams once or twice a year. Routine eye exams. Ask your health care provider how often you should have your eyes checked. Personal lifestyle choices, including: Daily care of your teeth and gums. Regular physical activity. Eating a healthy diet. Avoiding tobacco and drug use. Limiting alcohol use. Practicing safe sex. Taking low-dose aspirin every day. Taking vitamin and mineral supplements as recommended by your health care provider.  What happens during an annual well check? The services and screenings done by your health care provider during your annual well check will depend on your age, overall health, lifestyle risk factors, and family history of disease.  Counseling Your health care provider may ask you questions about your: Alcohol use. Tobacco use. Drug use. Emotional well-being. Home and relationship well-being. Sexual activity. Eating habits. History of falls. Memory and ability to understand (cognition). Work and work Statistician. Reproductive health.  Screening You may have the  following tests or measurements: Height, weight, and BMI. Blood pressure. Lipid and cholesterol levels. These may be checked every 5 years, or more frequently if you are over 40 years old. Skin check. Lung cancer screening. You may have this screening every year starting at age 55 if you have a 30-pack-year history of smoking and currently smoke or have quit within the past 15 years. Fecal occult blood test (FOBT) of the stool. You may have this test every year starting at age 88. Flexible sigmoidoscopy or colonoscopy. You may have a sigmoidoscopy every 5 years or a colonoscopy every 10 years starting at age 58. Hepatitis C blood test. Hepatitis B blood test. Sexually transmitted disease (STD) testing. Diabetes screening. This is done by checking your blood sugar (glucose) after you have not eaten for a while (fasting). You may have this done every 1-3 years. Bone density scan. This is done to screen for osteoporosis. You may have this done starting at age 50. Mammogram. This may be done every 1-2 years. Talk to your health care provider about how often you should have regular mammograms. Talk with your health care provider about your test results, treatment options, and if necessary, the need for more tests.  Vaccines Your health care provider may recommend certain vaccines, such as: Influenza vaccine. This is recommended every year. Tetanus, diphtheria, and acellular pertussis (Tdap, Td) vaccine. You may need a Td booster every 10 years. Zoster vaccine. You may need this after age 58. Pneumococcal 13-valent conjugate (PCV13) vaccine. One dose is recommended after age 58. Pneumococcal polysaccharide (PPSV23) vaccine. One dose is recommended after age 53. Talk to your health care provider about which screenings and vaccines you need and how often you need them.  This information is not intended to replace advice given to  you by your health care provider. Make sure you discuss any questions  you have with your health care provider. Document Released: 06/09/2015 Document Revised: 01/31/2016 Document Reviewed: 03/14/2015 Elsevier Interactive Patient Education  2017 [employer] Prevention in the Home  Falls can cause injuries. They can happen to people of all ages. There are many things you can do to make your home safe and to help prevent falls.  What can I do on the outside of my home? Regularly fix the edges of walkways and driveways and fix any cracks. Remove anything that might make you trip as you walk through a door, such as a raised step or threshold. Trim any bushes or trees on the path to your home. Use bright outdoor lighting. Clear any walking paths of anything that might make someone trip, such as rocks or tools. Regularly check to see if handrails are loose or broken. Make sure that both sides of any steps have handrails. Any raised decks and porches should have guardrails on the edges. Have any leaves, snow, or ice cleared regularly. Use sand or salt on walking paths during winter. Clean up any spills in your garage right away. This includes oil or grease spills.  What can I do in the bathroom? Use night lights. Install grab bars by the toilet and in the tub and shower. Do not use towel bars as grab bars. Use non-skid mats or decals in the tub or shower. If you need to sit down in the shower, use a plastic, non-slip stool. Keep the floor dry. Clean up any water that spills on the floor as soon as it happens. Remove soap buildup in the tub or shower regularly. Attach bath mats securely with double-sided non-slip rug tape. Do not have throw rugs and other things on the floor that can make you trip.  What can I do in the bedroom? Use night lights. Make sure that you have a light by your bed that is easy to reach. Do not use any sheets or blankets that are too big for your bed. They should not hang down onto the floor. Have a firm chair that has side  arms. You can use this for support while you get dressed. Do not have throw rugs and other things on the floor that can make you trip.  What can I do in the kitchen? Clean up any spills right away. Avoid walking on wet floors. Keep items that you use a lot in easy-to-reach places. If you need to reach something above you, use a strong step stool that has a grab bar. Keep electrical cords out of the way. Do not use floor polish or wax that makes floors slippery. If you must use wax, use non-skid floor wax. Do not have throw rugs and other things on the floor that can make you trip.  What can I do with my stairs? Do not leave any items on the stairs. Make sure that there are handrails on both sides of the stairs and use them. Fix handrails that are broken or loose. Make sure that handrails are as long as the stairways. Check any carpeting to make sure that it is firmly attached to the stairs. Fix any carpet that is loose or worn. Avoid having throw rugs at the top or bottom of the stairs. If you do have throw rugs, attach them to the floor with carpet tape. Make sure that you have a light switch at the top of the  stairs and the bottom of the stairs. If you do not have them, ask someone to add them for you.  What else can I do to help prevent falls? Wear shoes that: Do not have high heels. Have rubber bottoms. Are comfortable and fit you well. Are closed at the toe. Do not wear sandals. If you use a stepladder: Make sure that it is fully opened. Do not climb a closed stepladder. Make sure that both sides of the stepladder are locked into place. Ask someone to hold it for you, if possible. Clearly mark and make sure that you can see: Any grab bars or handrails. First and last steps. Where the edge of each step is. Use tools that help you move around (mobility aids) if they are needed. These include: Canes. Walkers. Scooters. Crutches. Turn on the lights when you go into a dark area.  Replace any light bulbs as soon as they burn out. Set up your furniture so you have a clear path. Avoid moving your furniture around. If any of your floors are uneven, fix them. If there are any pets around you, be aware of where they are. Review your medicines with your doctor. Some medicines can make you feel dizzy. This can increase your chance of falling. Ask your doctor what other things that you can do to help prevent falls.  This information is not intended to replace advice given to you by your health care provider. Make sure you discuss any questions you have with your health care provider. Document Released: 03/09/2009 Document Revised: 10/19/2015 Document Reviewed: 06/17/2014 Elsevier Interactive Patient Education  2017 [employer].

## 2022-07-29 ENCOUNTER — Other Ambulatory Visit: Payer: Self-pay | Admitting: Physician Assistant

## 2022-07-29 DIAGNOSIS — E782 Mixed hyperlipidemia: Secondary | ICD-10-CM

## 2022-08-22 ENCOUNTER — Ambulatory Visit (INDEPENDENT_AMBULATORY_CARE_PROVIDER_SITE_OTHER): Payer: Medicare HMO | Admitting: Physician Assistant

## 2022-08-22 ENCOUNTER — Encounter: Payer: Self-pay | Admitting: Physician Assistant

## 2022-08-22 VITALS — BP 118/80 | HR 91 | Temp 96.8°F | Ht 65.0 in | Wt 150.8 lb

## 2022-08-22 DIAGNOSIS — E559 Vitamin D deficiency, unspecified: Secondary | ICD-10-CM | POA: Diagnosis not present

## 2022-08-22 DIAGNOSIS — E782 Mixed hyperlipidemia: Secondary | ICD-10-CM

## 2022-08-22 DIAGNOSIS — Z5320 Procedure and treatment not carried out because of patient's decision for unspecified reasons: Secondary | ICD-10-CM

## 2022-08-22 DIAGNOSIS — I10 Essential (primary) hypertension: Secondary | ICD-10-CM

## 2022-08-22 DIAGNOSIS — R739 Hyperglycemia, unspecified: Secondary | ICD-10-CM | POA: Diagnosis not present

## 2022-08-22 DIAGNOSIS — J449 Chronic obstructive pulmonary disease, unspecified: Secondary | ICD-10-CM

## 2022-08-22 NOTE — Progress Notes (Signed)
Subjective:  Patient ID: Courtney Levine, female    DOB: 11-Dec-1950  Age: 72 y.o. MRN: [id number]  Chief Complaint  Patient presents with   Hypertension    Hypertension    Pt presents for follow up of hypertension. The patient is tolerating the medication well without side effects. Compliance with treatment has been good; including taking medication as directed , maintains a healthy diet and regular exercise regimen , and following up as directed. Pt currently taking zestril 20mg  qd  Mixed hyperlipidemia  Pt presents with hyperlipidemia.  Compliance with treatment has been good The patient is compliant with medications, maintains a low cholesterol diet , follows up as directed , and maintains an exercise regimen . The patient denies experiencing any hypercholesterolemia related symptoms. She is taking mevacor 20mg  qd  Pt with history of COPD -  She states at this time she is not having any trouble with her breathing (despite audible wheezes) - she uses symbicort daily and albuterol prn She has refused low dose lung CT screening for lung cancer     Current Outpatient Medications on File Prior to Visit  Medication Sig Dispense Refill   albuterol (VENTOLIN HFA) 108 (90 Base) MCG/ACT inhaler Inhale 2 puffs into the lungs every 4 (four) hours as needed for wheezing or shortness of breath. 1 each 3   lisinopril (ZESTRIL) 20 MG tablet TAKE 1 TABLET(20 MG) BY MOUTH DAILY 90 tablet 0   lovastatin (MEVACOR) 20 MG tablet TAKE 1 TABLET(20 MG) BY MOUTH AT BEDTIME 90 tablet 0   SYMBICORT 160-4.5 MCG/ACT inhaler Inhale 2 puffs into the lungs 2 (two) times daily. 30.6 g 1   No current facility-administered medications on file prior to visit.   Past Medical History:  Diagnosis Date   COPD (chronic obstructive pulmonary disease) ([place])    Hypercholesteremia    Hypertension    Past Surgical History:  Procedure Laterality Date   CATARACT EXTRACTION     2020    Family History  Problem  Relation Age of Onset   Pulmonary embolism Mother    Dementia Sister    Diabetes Brother    Social History   Socioeconomic History   Marital status: Single    Spouse name: Not on file   Number of children: 1   Years of education: Not on file   Highest education level: 10th grade  Occupational History   Occupation: [employer]   part time  Tobacco Use   Smoking status: Every Day    Packs/day: .5    Types: Cigarettes   Smokeless tobacco: Never  Vaping Use   Vaping Use: Never used  Substance and Sexual Activity   Alcohol use: Yes    Alcohol/week: 14.0 standard drinks of alcohol    Types: 14 Cans of beer per week    Comment: 1-2 beers daily   Drug use: No   Sexual activity: Not Currently  Other Topics Concern   Not on file  Social History Narrative   Not on file   Social Determinants of Health   Financial Resource Strain: Not on file  Food Insecurity: No Food Insecurity (07/17/2022)   Hunger Vital Sign    Worried About Running Out of Food in the Last Year: Never true    Ran Out of Food in the Last Year: Never true  Transportation Needs: No Transportation Needs (07/17/2022)   PRAPARE - Hydrologist (Medical): No    Lack of Transportation (Non-Medical):  No  Physical Activity: Inactive (07/17/2022)   Exercise Vital Sign    Days of Exercise per Week: 0 days    Minutes of Exercise per Session: 0 min  Stress: No Stress Concern Present (07/17/2022)   [place]    Feeling of Stress : Only a little  Social Connections: Not on file    CONSTITUTIONAL: Negative for chills, fatigue, fever, unintentional weight gain and unintentional weight loss.  E/N/T: Negative for ear pain, nasal congestion and sore throat.  CARDIOVASCULAR: Negative for chest pain, dizziness, palpitations and pedal edema.  RESPIRATORY: has chronic cough GASTROINTESTINAL: Negative for abdominal pain, acid  reflux symptoms, constipation, diarrhea, nausea and vomiting.  MSK: Negative for arthralgias and myalgias.  INTEGUMENTARY: Negative for rash.  NEUROLOGICAL: Negative for dizziness and headaches.  PSYCHIATRIC: Negative for sleep disturbance and to question depression screen.  Negative for depression, negative for anhedonia.       Objective:  PHYSICAL EXAM:   VS: BP 118/80 (BP Location: Left Arm, Patient Position: Sitting, Cuff Size: Large)   Pulse 91   Temp (!) 96.8 F (36 C) (Temporal)   Ht 5\' 5"  (1.651 m)   Wt 150 lb 12.8 oz (68.4 kg)   SpO2 93%   BMI 25.09 kg/m   GEN: Well nourished, well developed, in no acute distress   Cardiac: RRR; no murmurs, rubs, or gallops,no edema -  Respiratory:  scattered exp wheezes -  MS: no deformity or atrophy  Skin: warm and dry, no rash  Psych: euthymic mood, appropriate affect and demeanor  Lab Results  Component Value Date   WBC 9.3 02/19/2022   HGB 13.1 02/19/2022   HCT 35.8 02/19/2022   PLT 210 02/19/2022   GLUCOSE 104 (H) 02/19/2022   CHOL 173 02/19/2022   TRIG 82 02/19/2022   HDL 58 02/19/2022   LDLCALC 100 (H) 02/19/2022   ALT 16 02/19/2022   AST 20 02/19/2022   NA 144 02/19/2022   K 4.7 02/19/2022   CL 107 (H) 02/19/2022   CREATININE 0.87 02/19/2022   BUN 9 02/19/2022   CO2 22 02/19/2022   TSH 2.400 07/25/2021   HGBA1C 5.4 07/28/2020      Assessment & Plan:   Problem List Items Addressed This Visit       Cardiovascular and Mediastinum   Benign hypertension - Primary   Relevant Orders   CBC with Differential/Platelet   Comprehensive metabolic panel Continue meds             Other   Mixed hyperlipidemia   Relevant Orders   Lipid panel Continue med   Other Visit Diagnoses     Vitamin D deficiency     Labwork pending            COPD    Relevant Medications   SYMBICORT 160-4.5 MCG/ACT inhaler   Airsupra sample given  Refusal of treatment ---- does not want mammogram, dexa, colonoscopy, lung  CT screen, etc     .  No orders of the defined types were placed in this encounter.   Orders Placed This Encounter  Procedures   CBC with Differential/Platelet   Comprehensive metabolic panel   Lipid panel   VITAMIN D 25 Hydroxy (Vit-D Deficiency, Fractures)   TSH   Hemoglobin A1c     Follow-up: Return in about 6 months (around 02/22/2023) for chronic fasting follow up.  An After Visit Summary was printed and given to the patient.  SARA R  Kathie Rhodes [facility] [phone]

## 2022-08-23 LAB — COMPREHENSIVE METABOLIC PANEL
ALT: 16 IU/L (ref 0–32)
AST: 22 IU/L (ref 0–40)
Albumin/Globulin Ratio: 1.5 (ref 1.2–2.2)
Albumin: 4.1 g/dL (ref 3.8–4.8)
Alkaline Phosphatase: 95 IU/L (ref 44–121)
BUN/Creatinine Ratio: 13 (ref 12–28)
BUN: 12 mg/dL (ref 8–27)
Bilirubin Total: 0.3 mg/dL (ref 0.0–1.2)
CO2: 23 mmol/L (ref 20–29)
Calcium: 9.9 mg/dL (ref 8.7–10.3)
Chloride: 105 mmol/L (ref 96–106)
Creatinine, Ser: 0.93 mg/dL (ref 0.57–1.00)
Globulin, Total: 2.7 g/dL (ref 1.5–4.5)
Glucose: 100 mg/dL — ABNORMAL HIGH (ref 70–99)
Potassium: 4.2 mmol/L (ref 3.5–5.2)
Sodium: 142 mmol/L (ref 134–144)
Total Protein: 6.8 g/dL (ref 6.0–8.5)
eGFR: 65 mL/min/{1.73_m2} (ref >59)

## 2022-08-23 LAB — TSH: TSH: 2.81 u[IU]/mL (ref 0.450–4.500)

## 2022-08-23 LAB — LIPID PANEL
Chol/HDL Ratio: 3.2 ratio (ref 0.0–4.4)
Cholesterol, Total: 185 mg/dL (ref 100–199)
HDL: 57 mg/dL (ref >39)
LDL Chol Calc (NIH): 110 mg/dL — ABNORMAL HIGH (ref 0–99)
Triglycerides: 97 mg/dL (ref 0–149)
VLDL Cholesterol Cal: 18 mg/dL (ref 5–40)

## 2022-08-23 LAB — CBC WITH DIFFERENTIAL/PLATELET
Basophils Absolute: 0.1 10*3/uL (ref 0.0–0.2)
Basos: 1 %
EOS (ABSOLUTE): 0.4 10*3/uL (ref 0.0–0.4)
Eos: 4 %
Hematocrit: 41.9 % (ref 34.0–46.6)
Hemoglobin: 13.8 g/dL (ref 11.1–15.9)
Immature Grans (Abs): 0 10*3/uL (ref 0.0–0.1)
Immature Granulocytes: 0 %
Lymphocytes Absolute: 4 10*3/uL — ABNORMAL HIGH (ref 0.7–3.1)
Lymphs: 47 %
MCH: 34.1 pg — ABNORMAL HIGH (ref 26.6–33.0)
MCHC: 32.9 g/dL (ref 31.5–35.7)
MCV: 104 fL — ABNORMAL HIGH (ref 79–97)
Monocytes Absolute: 0.6 10*3/uL (ref 0.1–0.9)
Monocytes: 7 %
Neutrophils Absolute: 3.5 10*3/uL (ref 1.4–7.0)
Neutrophils: 41 %
Platelets: 200 10*3/uL (ref 150–450)
RBC: 4.05 x10E6/uL (ref 3.77–5.28)
RDW: 12.5 % (ref 11.7–15.4)
WBC: 8.6 10*3/uL (ref 3.4–10.8)

## 2022-08-23 LAB — HEMOGLOBIN A1C
Est. average glucose Bld gHb Est-mCnc: 114 mg/dL
Hgb A1c MFr Bld: 5.6 % (ref 4.8–5.6)

## 2022-08-23 LAB — VITAMIN D 25 HYDROXY (VIT D DEFICIENCY, FRACTURES): Vit D, 25-Hydroxy: 46.6 ng/mL (ref 30.0–100.0)

## 2022-08-23 LAB — CARDIOVASCULAR RISK ASSESSMENT

## 2022-09-27 ENCOUNTER — Other Ambulatory Visit: Payer: Self-pay | Admitting: Physician Assistant

## 2022-09-27 DIAGNOSIS — I10 Essential (primary) hypertension: Secondary | ICD-10-CM

## 2022-09-27 MED ORDER — BUDESONIDE-FORMOTEROL FUMARATE 160-4.5 MCG/ACT IN AERO: 2.0000 | INHALATION_SPRAY | Freq: Two times a day (BID) | RESPIRATORY_TRACT | 1 refills | Status: DC

## 2022-10-02 ENCOUNTER — Other Ambulatory Visit: Payer: Self-pay

## 2022-10-02 MED ORDER — BUDESONIDE-FORMOTEROL FUMARATE 160-4.5 MCG/ACT IN AERO: 2.0000 | INHALATION_SPRAY | Freq: Two times a day (BID) | RESPIRATORY_TRACT | 1 refills | Status: DC

## 2022-10-02 MED ORDER — ALBUTEROL SULFATE HFA 108 (90 BASE) MCG/ACT IN AERS: 2.0000 | INHALATION_SPRAY | RESPIRATORY_TRACT | 3 refills | Status: DC | PRN

## 2022-10-26 ENCOUNTER — Other Ambulatory Visit: Payer: Self-pay | Admitting: Physician Assistant

## 2022-10-26 DIAGNOSIS — E782 Mixed hyperlipidemia: Secondary | ICD-10-CM

## 2022-12-30 ENCOUNTER — Other Ambulatory Visit: Payer: Self-pay

## 2022-12-30 DIAGNOSIS — E782 Mixed hyperlipidemia: Secondary | ICD-10-CM

## 2022-12-30 MED ORDER — LOVASTATIN 20 MG PO TABS
20.0000 mg | ORAL_TABLET | Freq: Every day | ORAL | 1 refills | Status: DC
Start: 2022-12-30 — End: 2023-09-02

## 2022-12-31 ENCOUNTER — Other Ambulatory Visit: Payer: Self-pay | Admitting: Physician Assistant

## 2022-12-31 DIAGNOSIS — I10 Essential (primary) hypertension: Secondary | ICD-10-CM

## 2023-02-27 ENCOUNTER — Ambulatory Visit (INDEPENDENT_AMBULATORY_CARE_PROVIDER_SITE_OTHER): Payer: Medicare HMO | Admitting: Physician Assistant

## 2023-02-27 ENCOUNTER — Encounter: Payer: Self-pay | Admitting: Physician Assistant

## 2023-02-27 VITALS — BP 134/86 | HR 61 | Temp 97.0°F | Ht 65.0 in | Wt 148.2 lb

## 2023-02-27 DIAGNOSIS — I10 Essential (primary) hypertension: Secondary | ICD-10-CM | POA: Diagnosis not present

## 2023-02-27 DIAGNOSIS — E782 Mixed hyperlipidemia: Secondary | ICD-10-CM

## 2023-02-27 DIAGNOSIS — J449 Chronic obstructive pulmonary disease, unspecified: Secondary | ICD-10-CM | POA: Diagnosis not present

## 2023-02-27 DIAGNOSIS — Z23 Encounter for immunization: Secondary | ICD-10-CM

## 2023-02-27 DIAGNOSIS — E559 Vitamin D deficiency, unspecified: Secondary | ICD-10-CM | POA: Diagnosis not present

## 2023-02-27 DIAGNOSIS — R739 Hyperglycemia, unspecified: Secondary | ICD-10-CM

## 2023-02-27 DIAGNOSIS — R899 Unspecified abnormal finding in specimens from other organs, systems and tissues: Secondary | ICD-10-CM | POA: Diagnosis not present

## 2023-02-27 NOTE — Progress Notes (Signed)
Subjective:  Patient ID: Courtney Levine, female    DOB: Jul 16, 1950  Age: 72 y.o. MRN: [id number]  Chief Complaint  Patient presents with   Medical Management of Chronic Issues    Hypertension    Pt presents for follow up of hypertension. The patient is tolerating the medication well without side effects. Compliance with treatment has been good; including taking medication as directed , maintains a healthy diet and regular exercise regimen , and following up as directed. Pt currently taking zestril 20mg  qd  Mixed hyperlipidemia  Pt presents with hyperlipidemia.  Compliance with treatment has been good The patient is compliant with medications, maintains a low cholesterol diet , follows up as directed , and maintains an exercise regimen . The patient denies experiencing any hypercholesterolemia related symptoms. She is taking mevacor 20mg  qd  Pt with history of COPD -  She states at this time she is not having any trouble with her breathing (despite audible wheezes) - she uses symbicort daily and albuterol prn She refuses low dose lung CT screening for lung cancer  Pt would like flu shot today   Current Outpatient Medications on File Prior to Visit  Medication Sig Dispense Refill   albuterol (VENTOLIN HFA) 108 (90 Base) MCG/ACT inhaler Inhale 2 puffs into the lungs every 4 (four) hours as needed for wheezing or shortness of breath. 1 each 3   budesonide-formoterol (SYMBICORT) 160-4.5 MCG/ACT inhaler Inhale 2 puffs into the lungs 2 (two) times daily. 3 each 1   lisinopril (ZESTRIL) 20 MG tablet TAKE 1 TABLET(20 MG) BY MOUTH DAILY 90 tablet 0   lovastatin (MEVACOR) 20 MG tablet Take 1 tablet (20 mg total) by mouth daily. 100 tablet 1   No current facility-administered medications on file prior to visit.   Past Medical History:  Diagnosis Date   COPD (chronic obstructive pulmonary disease) ([facility])    Hypercholesteremia    Hypertension    Past Surgical History:  Procedure Laterality  Date   CATARACT EXTRACTION     2020    Family History  Problem Relation Age of Onset   Pulmonary embolism Mother    Dementia Sister    Diabetes Brother    Social History   Socioeconomic History   Marital status: Single    Spouse name: Not on file   Number of children: 1   Years of education: Not on file   Highest education level: 10th grade  Occupational History   Occupation: [employer]   part time  Tobacco Use   Smoking status: Every Day    Current packs/day: 0.50    Types: Cigarettes   Smokeless tobacco: Never  Vaping Use   Vaping status: Never Used  Substance and Sexual Activity   Alcohol use: Yes    Alcohol/week: 14.0 standard drinks of alcohol    Types: 14 Cans of beer per week    Comment: 1-2 beers daily   Drug use: No   Sexual activity: Not Currently  Other Topics Concern   Not on file  Social History Narrative   Not on file   Social Determinants of Health   Financial Resource Strain: Low Risk  (02/27/2023)   Overall Financial Resource Strain (CARDIA)    Difficulty of Paying Living Expenses: Not hard at all  Food Insecurity: No Food Insecurity (02/27/2023)   Hunger Vital Sign    Worried About Running Out of Food in the Last Year: Never true    Ran Out of Food in the  Last Year: Never true  Transportation Needs: No Transportation Needs (02/27/2023)   PRAPARE - Administrator, Civil Service (Medical): No    Lack of Transportation (Non-Medical): No  Physical Activity: Inactive (02/27/2023)   Exercise Vital Sign    Days of Exercise per Week: 0 days    Minutes of Exercise per Session: 0 min  Stress: No Stress Concern Present (02/27/2023)   [employer] of Occupational Health - Occupational Stress Questionnaire    Feeling of Stress : Only a little  Social Connections: Unknown (02/27/2023)   Social Connection and Isolation Panel [NHANES]    Frequency of Communication with Friends and Family: More than three times a week    Frequency  of Social Gatherings with Friends and Family: Three times a week    Attends Religious Services: Patient declined    Active Member of Clubs or Organizations: No    Attends Banker Meetings: Never    Marital Status: Patient declined    CONSTITUTIONAL: Negative for chills, fatigue, fever,  E/N/T: Negative for ear pain, nasal congestion and sore throat.  CARDIOVASCULAR: Negative for chest pain, dizziness, palpitations and pedal edema.  RESPIRATORY: Negative for recent cough and dyspnea.  GASTROINTESTINAL: Negative for abdominal pain, acid reflux symptoms, constipation, diarrhea, nausea and vomiting.  INTEGUMENTARY: Negative for rash.  PSYCHIATRIC: Negative for sleep disturbance and to question depression screen.  Negative for depression, negative for anhedonia.           Objective:  PHYSICAL EXAM:   VS: BP 134/86 (BP Location: Left Arm, Patient Position: Sitting, Cuff Size: Normal)   Pulse 61   Temp (!) 97 F (36.1 C) (Temporal)   Ht 5\' 5"  (1.651 m)   Wt 148 lb 3.2 oz (67.2 kg)   SpO2 95%   BMI 24.66 kg/m   GEN: Well nourished, well developed, in no acute distress   Cardiac: RRR; no murmurs, rubs, or gallops,no edema -  Respiratory:  normal respiratory rate and pattern with no distress - normal breath sounds with no rales, rhonchi, wheezes or rubs  Skin: warm and dry, no rash  Neuro:  Alert and Oriented x 3, - CN II-Xii grossly intact Psych: euthymic mood, appropriate affect and demeanor   Lab Results  Component Value Date   WBC 8.6 08/22/2022   HGB 13.8 08/22/2022   HCT 41.9 08/22/2022   PLT 200 08/22/2022   GLUCOSE 100 (H) 08/22/2022   CHOL 185 08/22/2022   TRIG 97 08/22/2022   HDL 57 08/22/2022   LDLCALC 110 (H) 08/22/2022   ALT 16 08/22/2022   AST 22 08/22/2022   NA 142 08/22/2022   K 4.2 08/22/2022   CL 105 08/22/2022   CREATININE 0.93 08/22/2022   BUN 12 08/22/2022   CO2 23 08/22/2022   TSH 2.810 08/22/2022   HGBA1C 5.6 08/22/2022       Assessment & Plan:   Problem List Items Addressed This Visit       Cardiovascular and Mediastinum   Benign hypertension - Primary   Relevant Orders   CBC with Differential/Platelet   Comprehensive metabolic panel Continue meds             Other   Mixed hyperlipidemia   Relevant Orders   Lipid panel Continue med   Other Visit Diagnoses     Vitamin D deficiency     Labwork pending            COPD    Relevant Medications  SYMBICORT 160-4.5 MCG/ACT inhaler   Continue albuterol as needed  Refusal of treatment ---- does not want mammogram, dexa, colonoscopy, lung CT screen, etc  Need for flu shot Fluad given     .  No orders of the defined types were placed in this encounter.   Orders Placed This Encounter  Procedures   Flu Vaccine Trivalent High Dose (Fluad)   CBC with Differential/Platelet   Comprehensive metabolic panel   Lipid panel   Hemoglobin A1c   VITAMIN D 25 Hydroxy (Vit-D Deficiency, Fractures)     Follow-up: Return in about 6 months (around 08/28/2023) for chronic fasting follow-up.  An After Visit Summary was printed and given to the patient.  Jettie Pagan [facility] [phone]

## 2023-02-28 ENCOUNTER — Other Ambulatory Visit: Payer: Self-pay | Admitting: Physician Assistant

## 2023-02-28 DIAGNOSIS — R899 Unspecified abnormal finding in specimens from other organs, systems and tissues: Secondary | ICD-10-CM

## 2023-02-28 LAB — CBC WITH DIFFERENTIAL/PLATELET
Basophils Absolute: 0.1 10*3/uL (ref 0.0–0.2)
Basos: 1 %
EOS (ABSOLUTE): 0.4 10*3/uL (ref 0.0–0.4)
Eos: 5 %
Hematocrit: 38 % (ref 34.0–46.6)
Hemoglobin: 13.5 g/dL (ref 11.1–15.9)
Immature Grans (Abs): 0 10*3/uL (ref 0.0–0.1)
Immature Granulocytes: 0 %
Lymphocytes Absolute: 3.1 10*3/uL (ref 0.7–3.1)
Lymphs: 38 %
MCH: 37.6 pg — ABNORMAL HIGH (ref 26.6–33.0)
MCHC: 35.5 g/dL (ref 31.5–35.7)
MCV: 106 fL — ABNORMAL HIGH (ref 79–97)
Monocytes Absolute: 0.5 10*3/uL (ref 0.1–0.9)
Monocytes: 7 %
Neutrophils Absolute: 4.1 10*3/uL (ref 1.4–7.0)
Neutrophils: 49 %
Platelets: 252 10*3/uL (ref 150–450)
RBC: 3.59 x10E6/uL — ABNORMAL LOW (ref 3.77–5.28)
RDW: 12.5 % (ref 11.7–15.4)
WBC: 8.2 10*3/uL (ref 3.4–10.8)

## 2023-02-28 LAB — HEMOGLOBIN A1C
Est. average glucose Bld gHb Est-mCnc: 114 mg/dL
Hgb A1c MFr Bld: 5.6 % (ref 4.8–5.6)

## 2023-02-28 LAB — LIPID PANEL
Chol/HDL Ratio: 3 {ratio} (ref 0.0–4.4)
Cholesterol, Total: 178 mg/dL (ref 100–199)
HDL: 60 mg/dL (ref >39)
LDL Chol Calc (NIH): 99 mg/dL (ref 0–99)
Triglycerides: 103 mg/dL (ref 0–149)
VLDL Cholesterol Cal: 19 mg/dL (ref 5–40)

## 2023-02-28 LAB — COMPREHENSIVE METABOLIC PANEL
ALT: 18 [IU]/L (ref 0–32)
AST: 22 [IU]/L (ref 0–40)
Albumin: 4.3 g/dL (ref 3.8–4.8)
Alkaline Phosphatase: 103 [IU]/L (ref 44–121)
BUN/Creatinine Ratio: 10 — ABNORMAL LOW (ref 12–28)
BUN: 9 mg/dL (ref 8–27)
Bilirubin Total: 0.3 mg/dL (ref 0.0–1.2)
CO2: 22 mmol/L (ref 20–29)
Calcium: 10.4 mg/dL — ABNORMAL HIGH (ref 8.7–10.3)
Chloride: 105 mmol/L (ref 96–106)
Creatinine, Ser: 0.86 mg/dL (ref 0.57–1.00)
Globulin, Total: 3 g/dL (ref 1.5–4.5)
Glucose: 98 mg/dL (ref 70–99)
Potassium: 5.5 mmol/L — ABNORMAL HIGH (ref 3.5–5.2)
Sodium: 143 mmol/L (ref 134–144)
Total Protein: 7.3 g/dL (ref 6.0–8.5)
eGFR: 71 mL/min/{1.73_m2} (ref >59)

## 2023-02-28 LAB — VITAMIN D 25 HYDROXY (VIT D DEFICIENCY, FRACTURES): Vit D, 25-Hydroxy: 50.6 ng/mL (ref 30.0–100.0)

## 2023-03-04 LAB — B12 AND FOLATE PANEL
Folate: >20 ng/mL (ref >3.0)
Vitamin B-12: 484 pg/mL (ref 232–1245)

## 2023-03-04 LAB — SPECIMEN STATUS REPORT

## 2023-04-09 ENCOUNTER — Other Ambulatory Visit: Payer: Self-pay | Admitting: Physician Assistant

## 2023-04-09 DIAGNOSIS — I10 Essential (primary) hypertension: Secondary | ICD-10-CM

## 2023-07-11 ENCOUNTER — Other Ambulatory Visit: Payer: Self-pay | Admitting: Physician Assistant

## 2023-07-11 DIAGNOSIS — I10 Essential (primary) hypertension: Secondary | ICD-10-CM

## 2023-09-02 ENCOUNTER — Encounter: Payer: Self-pay | Admitting: Physician Assistant

## 2023-09-02 ENCOUNTER — Ambulatory Visit (INDEPENDENT_AMBULATORY_CARE_PROVIDER_SITE_OTHER): Payer: Medicare HMO | Admitting: Physician Assistant

## 2023-09-02 VITALS — BP 150/92 | HR 79 | Temp 98.0°F | Resp 18 | Ht 65.0 in | Wt 143.0 lb

## 2023-09-02 DIAGNOSIS — J441 Chronic obstructive pulmonary disease with (acute) exacerbation: Secondary | ICD-10-CM | POA: Diagnosis not present

## 2023-09-02 DIAGNOSIS — R739 Hyperglycemia, unspecified: Secondary | ICD-10-CM

## 2023-09-02 DIAGNOSIS — I10 Essential (primary) hypertension: Secondary | ICD-10-CM | POA: Diagnosis not present

## 2023-09-02 DIAGNOSIS — E782 Mixed hyperlipidemia: Secondary | ICD-10-CM | POA: Diagnosis not present

## 2023-09-02 MED ORDER — LOVASTATIN 20 MG PO TABS: 20.0000 mg | ORAL_TABLET | Freq: Every day | ORAL | 1 refills | Status: DC

## 2023-09-02 MED ORDER — BUDESONIDE-FORMOTEROL FUMARATE 160-4.5 MCG/ACT IN AERO
2.0000 | INHALATION_SPRAY | Freq: Two times a day (BID) | RESPIRATORY_TRACT | 1 refills | Status: DC
Start: 2023-09-02 — End: 2023-10-09

## 2023-09-02 MED ORDER — LISINOPRIL-HYDROCHLOROTHIAZIDE 20-12.5 MG PO TABS: 1.0000 | ORAL_TABLET | Freq: Every day | ORAL | 1 refills | Status: DC

## 2023-09-02 NOTE — Progress Notes (Signed)
 Subjective:  Patient ID: Courtney Levine, female    DOB: 07-May-1951  Age: 73 y.o. MRN: [id number]  Chief Complaint  Patient presents with   Medical Management of Chronic Issues    Hypertension    Pt presents for follow up of hypertension. The patient is tolerating the medication well without side effects. Compliance with treatment has been good; including taking medication as directed , maintains a healthy diet and regular exercise regimen , and following up as directed. Pt currently taking zestril 20mg  qd Bp is elevated today at 150/92 - she states she does not take at home  Mixed hyperlipidemia  Pt presents with hyperlipidemia.  Compliance with treatment has been good The patient is compliant with medications, maintains a low cholesterol diet , follows up as directed , and maintains an exercise regimen . The patient denies experiencing any hypercholesterolemia related symptoms. She is taking mevacor 20mg  qd  Pt with history of COPD -  She states at this time she is not having any trouble with her breathing (despite audible wheezes) - she uses symbicort daily and albuterol prn She refuses low dose lung CT screening for lung cancer    Current Outpatient Medications on File Prior to Visit  Medication Sig Dispense Refill   albuterol (VENTOLIN HFA) 108 (90 Base) MCG/ACT inhaler Inhale 2 puffs into the lungs every 4 (four) hours as needed for wheezing or shortness of breath. 1 each 3   No current facility-administered medications on file prior to visit.   Past Medical History:  Diagnosis Date   COPD (chronic obstructive pulmonary disease) ([facility])    Hypercholesteremia    Hypertension    Past Surgical History:  Procedure Laterality Date   CATARACT EXTRACTION     2020    Family History  Problem Relation Age of Onset   Pulmonary embolism Mother    Dementia Sister    Diabetes Brother    Social History   Socioeconomic History   Marital status: Single    Spouse name: Not on  file   Number of children: 1   Years of education: Not on file   Highest education level: 10th grade  Occupational History   Occupation: [employer]   part time  Tobacco Use   Smoking status: Every Day    Current packs/day: 0.50    Types: Cigarettes   Smokeless tobacco: Never  Vaping Use   Vaping status: Never Used  Substance and Sexual Activity   Alcohol use: Yes    Alcohol/week: 14.0 standard drinks of alcohol    Types: 14 Cans of beer per week    Comment: 1-2 beers daily   Drug use: No   Sexual activity: Not Currently  Other Topics Concern   Not on file  Social History Narrative   Not on file   Social Drivers of Health   Financial Resource Strain: Low Risk  (02/27/2023)   Overall Financial Resource Strain (CARDIA)    Difficulty of Paying Living Expenses: Not hard at all  Food Insecurity: No Food Insecurity (02/27/2023)   Hunger Vital Sign    Worried About Running Out of Food in the Last Year: Never true    Ran Out of Food in the Last Year: Never true  Transportation Needs: No Transportation Needs (02/27/2023)   PRAPARE - Administrator, Civil Service (Medical): No    Lack of Transportation (Non-Medical): No  Physical Activity: Inactive (02/27/2023)   Exercise Vital Sign    Days of  Exercise per Week: 0 days    Minutes of Exercise per Session: 0 min  Stress: No Stress Concern Present (02/27/2023)   [employer] of Occupational Health - Occupational Stress Questionnaire    Feeling of Stress : Only a little  Social Connections: Unknown (02/27/2023)   Social Connection and Isolation Panel [NHANES]    Frequency of Communication with Friends and Family: More than three times a week    Frequency of Social Gatherings with Friends and Family: Three times a week    Attends Religious Services: Patient declined    Active Member of Clubs or Organizations: No    Attends Banker Meetings: Never    Marital Status: Patient declined     CONSTITUTIONAL: Negative for chills, fatigue, fever, unintentional weight gain and unintentional weight loss.  E/N/T: Negative for ear pain, nasal congestion and sore throat.  CARDIOVASCULAR: Negative for chest pain, dizziness, palpitations and pedal edema.  RESPIRATORY: Negative for recent cough and dyspnea.  GASTROINTESTINAL: Negative for abdominal pain, acid reflux symptoms, constipation, diarrhea, nausea and vomiting.  MSK: Negative for arthralgias and myalgias.  INTEGUMENTARY: Negative for rash.  NEUROLOGICAL: Negative for dizziness and headaches.  PSYCHIATRIC: Negative for sleep disturbance and to question depression screen.  Negative for depression, negative for anhedonia.       Objective:  PHYSICAL EXAM:   VS: BP (!) 150/92   Pulse 79   Temp 98 F (36.7 C) (Temporal)   Resp 18   Ht 5\' 5"  (1.651 m)   Wt 143 lb (64.9 kg)   SpO2 96%   BMI 23.80 kg/m   GEN: Well nourished, well developed, in no acute distress   Cardiac: RRR; no murmurs, rubs, or gallops,no edema -  Respiratory:  normal respiratory rate and pattern with no distress - normal breath sounds with no rales, rhonchi, wheezes or rubs  MS: no deformity or atrophy  Skin: warm and dry, no rash  Neuro:  Alert and Oriented x 3, - CN II-Xii grossly intact Psych: euthymic mood, appropriate affect and demeanor   Lab Results  Component Value Date   WBC 8.2 02/27/2023   HGB 13.5 02/27/2023   HCT 38.0 02/27/2023   PLT 252 02/27/2023   GLUCOSE 98 02/27/2023   CHOL 178 02/27/2023   TRIG 103 02/27/2023   HDL 60 02/27/2023   LDLCALC 99 02/27/2023   ALT 18 02/27/2023   AST 22 02/27/2023   NA 143 02/27/2023   K 5.5 (H) 02/27/2023   CL 105 02/27/2023   CREATININE 0.86 02/27/2023   BUN 9 02/27/2023   CO2 22 02/27/2023   TSH 2.810 08/22/2022   HGBA1C 5.6 02/27/2023      Assessment & Plan:   Problem List Items Addressed This Visit       Cardiovascular and Mediastinum   Benign hypertension - Primary    Relevant Orders   CBC with Differential/Platelet   Comprehensive metabolic panel Change to lisinopril 20/12.5mg  qd Recheck in 3 weeks        Hyperglycemia A1c pending Watch diet     Other   Mixed hyperlipidemia   Relevant Orders   Lipid panel Continue med   Other Visit Diagnoses     Vitamin D deficiency     Labwork pending            COPD    Relevant Medications   SYMBICORT 160-4.5 MCG/ACT inhaler   Continue albuterol as needed  Refusal of treatment ---- does not want mammogram, dexa, colonoscopy, lung  CT screen, etc       .  Meds ordered this encounter  Medications   lisinopril-hydrochlorothiazide (ZESTORETIC) 20-12.5 MG tablet    Sig: Take 1 tablet by mouth daily.    Dispense:  30 tablet    Refill:  1    Supervising Provider:   [facility], Fritzi Mandes [[id number]]   budesonide-formoterol (SYMBICORT) 160-4.5 MCG/ACT inhaler    Sig: Inhale 2 puffs into the lungs 2 (two) times daily.    Dispense:  3 each    Refill:  1    Supervising Provider:   [facility], Aniceto Boss   lovastatin (MEVACOR) 20 MG tablet    Sig: Take 1 tablet (20 mg total) by mouth daily.    Dispense:  100 tablet    Refill:  1    Supervising Provider:   Blane Ohara [phone]    Orders Placed This Encounter  Procedures   CBC with Differential/Platelet   Comprehensive metabolic panel with GFR   TSH   Lipid panel   Hemoglobin A1c     Follow-up: Return in about 6 months (around 03/03/2024) for chronic fasting follow-up - 3 weeks bp check and MCR wellness.  An After Visit Summary was printed and given to the patient.  Jettie Pagan [facility] [phone]

## 2023-09-03 ENCOUNTER — Telehealth: Payer: Self-pay | Admitting: Physician Assistant

## 2023-09-03 LAB — CBC WITH DIFFERENTIAL/PLATELET
Basophils Absolute: 0.1 10*3/uL (ref 0.0–0.2)
Basos: 1 %
EOS (ABSOLUTE): 0.4 10*3/uL (ref 0.0–0.4)
Eos: 5 %
Hematocrit: 37.5 % (ref 34.0–46.6)
Hemoglobin: 13.9 g/dL (ref 11.1–15.9)
Immature Grans (Abs): 0 10*3/uL (ref 0.0–0.1)
Immature Granulocytes: 0 %
Lymphocytes Absolute: 3.3 10*3/uL — ABNORMAL HIGH (ref 0.7–3.1)
Lymphs: 46 %
MCH: 38.7 pg — ABNORMAL HIGH (ref 26.6–33.0)
MCHC: 37.1 g/dL — ABNORMAL HIGH (ref 31.5–35.7)
MCV: 105 fL — ABNORMAL HIGH (ref 79–97)
Monocytes Absolute: 0.5 10*3/uL (ref 0.1–0.9)
Monocytes: 7 %
Neutrophils Absolute: 3 10*3/uL (ref 1.4–7.0)
Neutrophils: 41 %
Platelets: 192 10*3/uL (ref 150–450)
RBC: 3.59 x10E6/uL — ABNORMAL LOW (ref 3.77–5.28)
RDW: 12.4 % (ref 11.7–15.4)
WBC: 7.3 10*3/uL (ref 3.4–10.8)

## 2023-09-03 LAB — TSH: TSH: 2.99 u[IU]/mL (ref 0.450–4.500)

## 2023-09-03 LAB — HEMOGLOBIN A1C
Est. average glucose Bld gHb Est-mCnc: 108 mg/dL
Hgb A1c MFr Bld: 5.4 % (ref 4.8–5.6)

## 2023-09-03 LAB — COMPREHENSIVE METABOLIC PANEL WITH GFR
ALT: 15 IU/L (ref 0–32)
AST: 22 IU/L (ref 0–40)
Albumin: 4 g/dL (ref 3.8–4.8)
Alkaline Phosphatase: 93 IU/L (ref 44–121)
BUN/Creatinine Ratio: 9 — ABNORMAL LOW (ref 12–28)
BUN: 7 mg/dL — ABNORMAL LOW (ref 8–27)
Bilirubin Total: 0.3 mg/dL (ref 0.0–1.2)
CO2: 22 mmol/L (ref 20–29)
Calcium: 9.8 mg/dL (ref 8.7–10.3)
Chloride: 106 mmol/L (ref 96–106)
Creatinine, Ser: 0.82 mg/dL (ref 0.57–1.00)
Globulin, Total: 2.8 g/dL (ref 1.5–4.5)
Glucose: 96 mg/dL (ref 70–99)
Potassium: 4.7 mmol/L (ref 3.5–5.2)
Sodium: 144 mmol/L (ref 134–144)
Total Protein: 6.8 g/dL (ref 6.0–8.5)
eGFR: 75 mL/min/{1.73_m2} (ref >59)

## 2023-09-03 LAB — LIPID PANEL
Chol/HDL Ratio: 3.2 ratio (ref 0.0–4.4)
Cholesterol, Total: 171 mg/dL (ref 100–199)
HDL: 54 mg/dL (ref >39)
LDL Chol Calc (NIH): 101 mg/dL — ABNORMAL HIGH (ref 0–99)
Triglycerides: 88 mg/dL (ref 0–149)
VLDL Cholesterol Cal: 16 mg/dL (ref 5–40)

## 2023-09-03 NOTE — Telephone Encounter (Signed)
 Patient called and she says the pharmacy told her she has 5 prescriptions to pick up and she says only 3 was supposed to be sent in for her cholesterol, BP, and symbicort inhaler. Advised those are the only one's sent and on her profile along with albuterol inhaler. So if the pharmacy has additional meds to pick up, they need to be canceled out on their end. She says she will go back down there and start the new BP medication tomorrow since she already took lisinopril this morning.  Copied from CRM [id number]. Topic: Clinical - Medication Question >> Sep 03, 2023  9:58 AM Elmarie Shiley S wrote: Reason for CRM: Patient stated she needs more clarification about her prescriptions please call patient

## 2023-09-25 ENCOUNTER — Ambulatory Visit

## 2023-09-25 VITALS — BP 138/80 | HR 84 | Temp 98.0°F | Resp 16 | Ht 65.0 in | Wt 139.6 lb

## 2023-09-25 DIAGNOSIS — Z2821 Immunization not carried out because of patient refusal: Secondary | ICD-10-CM

## 2023-09-25 DIAGNOSIS — Z0000 Encounter for general adult medical examination without abnormal findings: Secondary | ICD-10-CM | POA: Diagnosis not present

## 2023-09-25 NOTE — Progress Notes (Signed)
 Subjective:   Courtney Levine is a 73 y.o. female who presents for Medicare Annual (Subsequent) preventive examination.  This wellness visit is conducted by a nurse.  The patient's medications were reviewed and reconciled since the patient's last visit.  History details were provided by the patient.  The history appears to be reliable.    Patient's last AWV was one year ago.   Medical History: Patient history and Family history was reviewed  Medications, Allergies, and preventative health maintenance was reviewed and updated.   Visit Complete: In person  Cardiac Risk Factors include: advanced age (>75men, >28 women);smoking/ tobacco exposure     Objective:    Today's Vitals   09/25/23 0840  BP: 138/80  Pulse: 84  Resp: 16  Temp: 98 F (36.7 C)  SpO2: 92%  Weight: 139 lb 9.6 oz (63.3 kg)  Height: 5\' 5"  (1.651 m)  PainSc: 0-No pain   Body mass index is 23.23 kg/m.     09/25/2023    8:56 AM 02/06/2017    5:00 AM 02/04/2017    8:44 PM  Advanced Directives  Does Patient Have a Medical Advance Directive? No No No  Would patient like information on creating a medical advance directive? No - Patient declined No - Patient declined     Current Medications (verified) Outpatient Encounter Medications as of 09/25/2023  Medication Sig   albuterol  (VENTOLIN  HFA) 108 (90 Base) MCG/ACT inhaler Inhale 2 puffs into the lungs every 4 (four) hours as needed for wheezing or shortness of breath.   budesonide -formoterol  (SYMBICORT ) 160-4.5 MCG/ACT inhaler Inhale 2 puffs into the lungs 2 (two) times daily.   lisinopril -hydrochlorothiazide  (ZESTORETIC ) 20-12.5 MG tablet Take 1 tablet by mouth daily.   lovastatin  (MEVACOR ) 20 MG tablet Take 1 tablet (20 mg total) by mouth daily.   No facility-administered encounter medications on file as of 09/25/2023.    Allergies (verified) Patient has no known allergies.   History: Past Medical History:  Diagnosis Date   COPD (chronic obstructive  pulmonary disease) (HCC)    Hypercholesteremia    Hypertension    Past Surgical History:  Procedure Laterality Date   CATARACT EXTRACTION     2020   Family History  Problem Relation Age of Onset   Pulmonary embolism Mother    Dementia Sister    Diabetes Brother    Social History   Socioeconomic History   Marital status: Single    Spouse name: Not on file   Number of children: 1   Years of education: Not on file   Highest education level: 10th grade  Occupational History   Occupation: [employer]   part time  Tobacco Use   Smoking status: Every Day    Current packs/day: 0.50    Types: Cigarettes   Smokeless tobacco: Never  Vaping Use   Vaping status: Never Used  Substance and Sexual Activity   Alcohol use: Yes    Alcohol/week: 14.0 standard drinks of alcohol    Types: 14 Cans of beer per week    Comment: 1-2 beers daily   Drug use: No   Sexual activity: Not Currently  Other Topics Concern   Not on file  Social History Narrative   Not on file   Social Drivers of Health   Financial Resource Strain: Low Risk  (09/25/2023)   Overall Financial Resource Strain (CARDIA)    Difficulty of Paying Living Expenses: Not hard at all  Food Insecurity: No Food Insecurity (09/25/2023)   Hunger  Vital Sign    Worried About Programme researcher, broadcasting/film/video in the Last Year: Never true    Ran Out of Food in the Last Year: Never true  Transportation Needs: No Transportation Needs (09/25/2023)   PRAPARE - Administrator, Civil Service (Medical): No    Lack of Transportation (Non-Medical): No  Physical Activity: Insufficiently Active (09/25/2023)   Exercise Vital Sign    Days of Exercise per Week: 4 days    Minutes of Exercise per Session: 20 min  Stress: No Stress Concern Present (09/25/2023)   [employer] of Occupational Health - Occupational Stress Questionnaire    Feeling of Stress : Not at all  Social Connections: Unknown (09/25/2023)   Social Connection and Isolation  Panel [NHANES]    Frequency of Communication with Friends and Family: More than three times a week    Frequency of Social Gatherings with Friends and Family: Three times a week    Attends Religious Services: Patient declined    Active Member of Clubs or Organizations: No    Attends Banker Meetings: Never    Marital Status: Patient declined    Tobacco Counseling Ready to quit: Not Answered Counseling given: Not Answered   Clinical Intake:  Pre-visit preparation completed: Yes  Pain : No/denies pain Pain Score: 0-No pain     BMI - recorded: 23.23 Nutritional Status: BMI of 19-24  Normal Nutritional Risks: None Diabetes: No  How often do you need to have someone help you when you read instructions, pamphlets, or other written materials from your doctor or pharmacy?: 1 - Never What is the last grade level you completed in school?: 10  Interpreter Needed?: No      Activities of Daily Living    09/25/2023    8:47 AM  In your present state of health, do you have any difficulty performing the following activities:  Hearing? 0  Vision? 0  Difficulty concentrating or making decisions? 0  Walking or climbing stairs? 0  Dressing or bathing? 0  Doing errands, shopping? 0  Preparing Food and eating ? N  Using the Toilet? N  In the past six months, have you accidently leaked urine? N  Do you have problems with loss of bowel control? N  Managing your Medications? N  Managing your Finances? N  Housekeeping or managing your Housekeeping? N    Patient Care Team: Cyndi Drain, PA-C as PCP - General (Physician Assistant)  Indicate any recent Medical Services you may have received from other than [facility] providers in the past year (date may be approximate).     Assessment:   This is a routine wellness examination for [name].  Hearing/Vision screen Vision Screening   Right eye Left eye Both eyes  Without correction 20/20 20/20 20/20   With correction         Goals Addressed   None    Depression Screen    09/25/2023    8:53 AM 09/02/2023    9:22 AM 02/27/2023    9:25 AM 08/22/2022    8:50 AM 07/17/2022   10:16 AM 02/19/2022    9:26 AM 01/25/2021    9:20 AM  PHQ 2/9 Scores  PHQ - 2 Score 0 0 0 0 0 0 0  PHQ- 9 Score      0     Fall Risk    09/25/2023    8:46 AM 09/02/2023    9:22 AM 02/27/2023    9:25 AM 07/17/2022   10:22  AM 02/19/2022    9:25 AM  Fall Risk   Falls in the past year? 0 0 0 0 0  Number falls in past yr: 0 0 0 0 0  Injury with Fall? 0 0 0 0 0  Risk for fall due to : No Fall Risks No Fall Risks No Fall Risks No Fall Risks No Fall Risks  Follow up Falls evaluation completed;Education provided Falls evaluation completed Falls evaluation completed Falls evaluation completed;Education provided Falls evaluation completed    MEDICARE RISK AT HOME: Medicare Risk at Home Any stairs in or around the home?: Yes If so, are there any without handrails?: No Home free of loose throw rugs in walkways, pet beds, electrical cords, etc?: Yes Adequate lighting in your home to reduce risk of falls?: Yes Life alert?: No Use of a cane, walker or w/c?: No Grab bars in the bathroom?: No Shower chair or bench in shower?: No Elevated toilet seat or a handicapped toilet?: No  TIMED UP AND GO:  Was the test performed?  Yes  Length of time to ambulate 10 feet: 5 sec Gait steady and fast without use of assistive device    Cognitive Function:        09/25/2023    8:57 AM 07/17/2022   10:23 AM  6CIT Screen  What Year? 0 points 0 points  What month? 0 points 0 points  What time? 0 points 0 points  Count back from 20 0 points 0 points  Months in reverse 2 points 0 points  Repeat phrase 0 points 0 points  Total Score 2 points 0 points    Immunizations Immunization History  Administered Date(s) Administered   Fluad Quad(high Dose 65+) 03/09/2020, 01/25/2021, 02/19/2022   Fluad Trivalent(High Dose 65+) 02/27/2023   Influenza-Unspecified  02/25/2019   PFIZER(Purple Top)SARS-COV-2 Vaccination 11/04/2019, 11/26/2019, 06/01/2020   Pneumococcal Polysaccharide-23 02/06/2017    TDAP status: Declined  Flu Vaccine status: Up to date  Pneumococcal vaccine status: Declined,  Education has been provided regarding the importance of this vaccine but patient still declined. Advised may receive this vaccine at local pharmacy or Health Dept. Aware to provide a copy of the vaccination record if obtained from local pharmacy or Health Dept. Verbalized acceptance and understanding.   Covid-19 vaccine status: Declined, Education has been provided regarding the importance of this vaccine but patient still declined. Advised may receive this vaccine at local pharmacy or Health Dept.or vaccine clinic. Aware to provide a copy of the vaccination record if obtained from local pharmacy or Health Dept. Verbalized acceptance and understanding.  Qualifies for Shingles Vaccine? Yes   Zostavax completed No   Shingrix Completed?: No.    Education has been provided regarding the importance of this vaccine. Patient has been advised to call insurance company to determine out of pocket expense if they have not yet received this vaccine. Advised may also receive vaccine at local pharmacy or Health Dept. Verbalized acceptance and understanding.  Screening Tests Health Maintenance  Topic Date Due   Medicare Annual Wellness (AWV)  07/18/2023   INFLUENZA VACCINE  12/26/2023   HPV VACCINES  Aged Out   Meningococcal B Vaccine  Aged Out   DTaP/Tdap/Td  Discontinued   Pneumonia Vaccine 58+ Years old  Discontinued   MAMMOGRAM  Discontinued   DEXA SCAN  Discontinued   Colonoscopy  Discontinued   COVID-19 Vaccine  Discontinued   Hepatitis C Screening  Discontinued   Zoster Vaccines- Shingrix  Discontinued    Health Maintenance  Health Maintenance Due  Topic Date Due   Medicare Annual Wellness (AWV)  07/18/2023    Colorectal cancer screening: No longer  required.   Mammogram status: No longer required due to age.  Bone Density status: Declined   Additional Screening:  Vision Screening: Recommended annual ophthalmology exams for early detection of glaucoma and other disorders of the eye. Is the patient up to date with their annual eye exam?  No   Patient declined    Dental Screening: Recommended annual dental exams for proper oral hygiene  Community Resource Referral / Chronic Care Management: CRR required this visit?  No   CCM required this visit?  No     Plan:    1- Patient declined all vaccines and DEXA  I have personally reviewed and noted the following in the patient's chart:   Medical and social history Use of alcohol, tobacco or illicit drugs  Current medications and supplements including opioid prescriptions. Patient is not currently taking opioid prescriptions. Functional ability and status Nutritional status Physical activity Advanced directives List of other physicians Hospitalizations, surgeries, and ER visits in previous 12 months Vitals Screenings to include cognitive, depression, and falls Referrals and appointments  In addition, I have reviewed and discussed with patient certain preventive protocols, quality metrics, and best practice recommendations. A written personalized care plan for preventive services as well as general preventive health recommendations were provided to patient.     Ruthellen Cowden, LPN   06/01/1094

## 2023-09-25 NOTE — Patient Instructions (Signed)
 Courtney Levine , Thank you for taking time to come for your Medicare Wellness Visit. I appreciate your ongoing commitment to your health goals. Please review the following plan we discussed and let me know if I can assist you in the future.   This is a list of the screening recommended for you and due dates:  Health Maintenance  Topic Date Due   Medicare Annual Wellness Visit  07/18/2023   Flu Shot  12/26/2023   HPV Vaccine  Aged Out   Meningitis B Vaccine  Aged Out   DTaP/Tdap/Td vaccine  Discontinued   Pneumonia Vaccine  Discontinued   Mammogram  Discontinued   DEXA scan (bone density measurement)  Discontinued   Colon Cancer Screening  Discontinued   COVID-19 Vaccine  Discontinued   Hepatitis C Screening  Discontinued   Zoster (Shingles) Vaccine  Discontinued    Preventive Care 65 Years and Older, Female Preventive care refers to lifestyle choices and visits with your health care provider that can promote health and wellness. What does preventive care include? A yearly physical exam. This is also called an annual well check. Dental exams once or twice a year. Routine eye exams. Ask your health care provider how often you should have your eyes checked. Personal lifestyle choices, including: Daily care of your teeth and gums. Regular physical activity. Eating a healthy diet. Avoiding tobacco and drug use. Limiting alcohol use. Practicing safe sex. Taking low-dose aspirin every day. Taking vitamin and mineral supplements as recommended by your health care provider. What happens during an annual well check? The services and screenings done by your health care provider during your annual well check will depend on your age, overall health, lifestyle risk factors, and family history of disease. Counseling  Your health care provider may ask you questions about your: Alcohol use. Tobacco use. Drug use. Emotional well-being. Home and relationship well-being. Sexual activity. Eating  habits. History of falls. Memory and ability to understand (cognition). Work and work Astronomer. Reproductive health. Screening  You may have the following tests or measurements: Height, weight, and BMI. Blood pressure. Lipid and cholesterol levels. These may be checked every 5 years, or more frequently if you are over 11 years old. Skin check. Lung cancer screening. You may have this screening every year starting at age 66 if you have a 30-pack-year history of smoking and currently smoke or have quit within the past 15 years. Fecal occult blood test (FOBT) of the stool. You may have this test every year starting at age 53. Flexible sigmoidoscopy or colonoscopy. You may have a sigmoidoscopy every 5 years or a colonoscopy every 10 years starting at age 42. Hepatitis C blood test. Hepatitis B blood test. Sexually transmitted disease (STD) testing. Diabetes screening. This is done by checking your blood sugar (glucose) after you have not eaten for a while (fasting). You may have this done every 1-3 years. Bone density scan. This is done to screen for osteoporosis. You may have this done starting at age 52. Mammogram. This may be done every 1-2 years. Talk to your health care provider about how often you should have regular mammograms. Talk with your health care provider about your test results, treatment options, and if necessary, the need for more tests. Vaccines  Your health care provider may recommend certain vaccines, such as: Influenza vaccine. This is recommended every year. Tetanus, diphtheria, and acellular pertussis (Tdap, Td) vaccine. You may need a Td booster every 10 years. Zoster vaccine. You may need this  after age 83. Pneumococcal 13-valent conjugate (PCV13) vaccine. One dose is recommended after age 61. Pneumococcal polysaccharide (PPSV23) vaccine. One dose is recommended after age 59. Talk to your health care provider about which screenings and vaccines you need and how  often you need them. This information is not intended to replace advice given to you by your health care provider. Make sure you discuss any questions you have with your health care provider. Document Released: 06/09/2015 Document Revised: 01/31/2016 Document Reviewed: 03/14/2015 Elsevier Interactive Patient Education  2017 [employer].  Fall Prevention in the Home Falls can cause injuries. They can happen to people of all ages. There are many things you can do to make your home safe and to help prevent falls. What can I do on the outside of my home? Regularly fix the edges of walkways and driveways and fix any cracks. Remove anything that might make you trip as you walk through a door, such as a raised step or threshold. Trim any bushes or trees on the path to your home. Use bright outdoor lighting. Clear any walking paths of anything that might make someone trip, such as rocks or tools. Regularly check to see if handrails are loose or broken. Make sure that both sides of any steps have handrails. Any raised decks and porches should have guardrails on the edges. Have any leaves, snow, or ice cleared regularly. Use sand or salt on walking paths during winter. Clean up any spills in your garage right away. This includes oil or grease spills. What can I do in the bathroom? Use night lights. Install grab bars by the toilet and in the tub and shower. Do not use towel bars as grab bars. Use non-skid mats or decals in the tub or shower. If you need to sit down in the shower, use a plastic, non-slip stool. Keep the floor dry. Clean up any water that spills on the floor as soon as it happens. Remove soap buildup in the tub or shower regularly. Attach bath mats securely with double-sided non-slip rug tape. Do not have throw rugs and other things on the floor that can make you trip. What can I do in the bedroom? Use night lights. Make sure that you have a light by your bed that is easy to  reach. Do not use any sheets or blankets that are too big for your bed. They should not hang down onto the floor. Have a firm chair that has side arms. You can use this for support while you get dressed. Do not have throw rugs and other things on the floor that can make you trip. What can I do in the kitchen? Clean up any spills right away. Avoid walking on wet floors. Keep items that you use a lot in easy-to-reach places. If you need to reach something above you, use a strong step stool that has a grab bar. Keep electrical cords out of the way. Do not use floor polish or wax that makes floors slippery. If you must use wax, use non-skid floor wax. Do not have throw rugs and other things on the floor that can make you trip. What can I do with my stairs? Do not leave any items on the stairs. Make sure that there are handrails on both sides of the stairs and use them. Fix handrails that are broken or loose. Make sure that handrails are as long as the stairways. Check any carpeting to make sure that it is firmly attached to the  stairs. Fix any carpet that is loose or worn. Avoid having throw rugs at the top or bottom of the stairs. If you do have throw rugs, attach them to the floor with carpet tape. Make sure that you have a light switch at the top of the stairs and the bottom of the stairs. If you do not have them, ask someone to add them for you. What else can I do to help prevent falls? Wear shoes that: Do not have high heels. Have rubber bottoms. Are comfortable and fit you well. Are closed at the toe. Do not wear sandals. If you use a stepladder: Make sure that it is fully opened. Do not climb a closed stepladder. Make sure that both sides of the stepladder are locked into place. Ask someone to hold it for you, if possible. Clearly mark and make sure that you can see: Any grab bars or handrails. First and last steps. Where the edge of each step is. Use tools that help you move  around (mobility aids) if they are needed. These include: Canes. Walkers. Scooters. Crutches. Turn on the lights when you go into a dark area. Replace any light bulbs as soon as they burn out. Set up your furniture so you have a clear path. Avoid moving your furniture around. If any of your floors are uneven, fix them. If there are any pets around you, be aware of where they are. Review your medicines with your doctor. Some medicines can make you feel dizzy. This can increase your chance of falling. Ask your doctor what other things that you can do to help prevent falls. This information is not intended to replace advice given to you by your health care provider. Make sure you discuss any questions you have with your health care provider. Document Released: 03/09/2009 Document Revised: 10/19/2015 Document Reviewed: 06/17/2014 Elsevier Interactive Patient Education  2017 [employer].

## 2023-10-01 ENCOUNTER — Other Ambulatory Visit: Payer: Self-pay | Admitting: Physician Assistant

## 2023-10-01 DIAGNOSIS — I10 Essential (primary) hypertension: Secondary | ICD-10-CM

## 2023-10-01 NOTE — Telephone Encounter (Signed)
 Copied from CRM [id number]. Topic: Clinical - Medication Refill >> Oct 01, 2023  2:15 PM Lin Rend J wrote: Medication: lisinopril -hydrochlorothiazide  (ZESTORETIC ) 20-12.5 MG table  Has the patient contacted their pharmacy? Yes (Agent: If no, request that the patient contact the pharmacy for the refill. If patient does not wish to contact the pharmacy document the reason why and proceed with request.) (Agent: If yes, when and what did the pharmacy advise?)  This is the patient's preferred pharmacy:  [id number], [place] - [address] [place] [address] Phone: [phone] Fax: [phone]    Is this the correct pharmacy for this prescription? Yes If no, delete pharmacy and type the correct one.   Has the prescription been filled recently? Yes  Is the patient out of the medication? No  Has the patient been seen for an appointment in the last year OR does the patient have an upcoming appointment? Yes  Can we respond through MyChart? No  Agent: Please be advised that Rx refills may take up to 3 business days. We ask that you follow-up with your pharmacy.

## 2023-10-01 NOTE — Telephone Encounter (Signed)
 Last Fill: 09/02/23  Last OV: 09/02/23 Next OV: 03/04/24  Routing to provider for review/authorization.

## 2023-10-02 MED ORDER — LISINOPRIL-HYDROCHLOROTHIAZIDE 20-12.5 MG PO TABS: 1.0000 | ORAL_TABLET | Freq: Every day | ORAL | 1 refills | Status: DC

## 2023-10-08 ENCOUNTER — Ambulatory Visit: Payer: Self-pay

## 2023-10-08 NOTE — Telephone Encounter (Signed)
  Chief Complaint: hypertension Symptoms: elevated BP in the morning Frequency: x 3-4 days Pertinent Negatives: Patient denies blurred vision, chest pain, SOB, headaches, unilateral numbness or weakness. Disposition: [] ED /[] Urgent Care (no appt availability in office) / [x] Appointment(In office/virtual)/ []  [facility]/ [] Home Care/ [] Refused Recommended Disposition /[] [facility]/ []  Follow-up with PCP Additional Notes: Advised patient to keep a log of her BP readings to bring for office visit tomorrow. Yesterday and today's readings listed in triage note. Patient agreeable to see provider tomorrow and verbalizes understanding to call back for new or worsening symptoms.  Copied from CRM [id number]. Topic: Clinical - Red Word Triage >> Oct 08, 2023  2:16 PM Phil Braun wrote: Red Word that prompted transfer to Nurse Triage:   Pt is stating that her bp is running high 166/84 and is now 126/72.  She is taking her medication Reason for Disposition  [1] Systolic BP  >= 130 OR Diastolic >= 80 AND [2] taking BP medications  Answer Assessment - Initial Assessment Questions 1. BLOOD PRESSURE: "What is the blood pressure?" "Did you take at least two measurements 5 minutes apart?"     166/84 (yesterday morning around 1030-11); 138/80 yesterday afternoon, 150/82 this morning, 126/72 by the afternoon today.  2. ONSET: "When did you take your blood pressure?"     In the morning around 1030-11am ; x 3-4 days  3. HOW: "How did you take your blood pressure?" (e.g., automatic home BP monitor, visiting nurse)     Wrist automatic home BP monitor.  4. HISTORY: "Do you have a history of high blood pressure?"     Yes.  5. MEDICINES: "Are you taking any medicines for blood pressure?" "Have you missed any doses recently?"     Lisinopril -hydrochlorothiazide , takes around 0830-9am . Denies missing any doses.  6. OTHER SYMPTOMS: "Do you have any symptoms?" (e.g., blurred vision, chest pain,  difficulty breathing, headache, weakness)     Denies.  7. PREGNANCY: "Is there any chance you are pregnant?" "When was your last menstrual period?"     N/A.  Protocols used: Blood Pressure - High-A-AH

## 2023-10-09 ENCOUNTER — Ambulatory Visit (INDEPENDENT_AMBULATORY_CARE_PROVIDER_SITE_OTHER): Admitting: Physician Assistant

## 2023-10-09 ENCOUNTER — Encounter: Payer: Self-pay | Admitting: Physician Assistant

## 2023-10-09 VITALS — BP 146/80 | HR 81 | Temp 97.7°F | Resp 16 | Ht 65.0 in | Wt 139.0 lb

## 2023-10-09 DIAGNOSIS — R634 Abnormal weight loss: Secondary | ICD-10-CM | POA: Diagnosis not present

## 2023-10-09 DIAGNOSIS — Z5320 Procedure and treatment not carried out because of patient's decision for unspecified reasons: Secondary | ICD-10-CM | POA: Diagnosis not present

## 2023-10-09 DIAGNOSIS — I10 Essential (primary) hypertension: Secondary | ICD-10-CM

## 2023-10-09 DIAGNOSIS — J441 Chronic obstructive pulmonary disease with (acute) exacerbation: Secondary | ICD-10-CM

## 2023-10-09 MED ORDER — BUDESONIDE-FORMOTEROL FUMARATE 160-4.5 MCG/ACT IN AERO: 2.0000 | INHALATION_SPRAY | Freq: Two times a day (BID) | RESPIRATORY_TRACT | 5 refills | Status: DC

## 2023-10-09 MED ORDER — LISINOPRIL-HYDROCHLOROTHIAZIDE 20-25 MG PO TABS: 1.0000 | ORAL_TABLET | Freq: Every day | ORAL | 0 refills | Status: DC

## 2023-10-09 NOTE — Progress Notes (Signed)
 Subjective:  Patient ID: Courtney Levine, female    DOB: [birth year]  Age: 73 y.o. MRN: [id number]  Chief Complaint  Patient presents with   Hypertension    HPI Pt in today for blood pressure follow up - she is concerned because her bp at home has continued to fluctuate in the 140s/150s/70s-80s She would like to have better management of values.  She denies chest pain or dyspnea.  Her readings have improved since changing to zestoretic  20/12.5 She denies chest pain/sob - she does still have minimal edema of ankles  Of note pt continues to lose weight - pt blames on fact she is trying not to eat much to help her blood pressure We have discussed multiple times that she really needs a workup to see if there is other reason for weight loss --- she refuses lung CT cancer screen, colonoscopy, mammogram and any other diagnostic testing Pt does not want labwork today - will get at next visit    09/25/2023    8:53 AM 09/02/2023    9:22 AM 02/27/2023    9:25 AM 08/22/2022    8:50 AM 07/17/2022   10:16 AM  Depression screen PHQ 2/9  Decreased Interest 0 0 0 0 0  Down, Depressed, Hopeless 0 0 0 0 0  PHQ - 2 Score 0 0 0 0 0        02/19/2022    9:25 AM 07/17/2022   10:22 AM 02/27/2023    9:25 AM 09/02/2023    9:22 AM 09/25/2023    8:46 AM  Fall Risk  Falls in the past year? 0 0 0 0 0  Was there an injury with Fall? 0 0 0 0 0  Fall Risk Category Calculator 0 0 0 0 0  Fall Risk Category (Retired) Low      (RETIRED) Patient Fall Risk Level Low fall risk      Patient at Risk for Falls Due to No Fall Risks No Fall Risks No Fall Risks No Fall Risks No Fall Risks  Fall risk Follow up Falls evaluation completed Falls evaluation completed;Education provided Falls evaluation completed Falls evaluation completed Falls evaluation completed;Education provided     ROS CONSTITUTIONAL: see HPI E/N/T: Negative for ear pain, nasal congestion and sore throat.  CARDIOVASCULAR: Negative for chest pain, dizziness,  palpitations - has mild pedal edema RESPIRATORY: Negative for recent cough and dyspnea.  GASTROINTESTINAL: Negative for abdominal pain, acid reflux symptoms, constipation, diarrhea, nausea and vomiting.  PSYCHIATRIC: Negative for sleep disturbance and to question depression screen.  Negative for depression, negative for anhedonia.    Current Outpatient Medications:    albuterol  (VENTOLIN  HFA) 108 (90 Base) MCG/ACT inhaler, Inhale 2 puffs into the lungs every 4 (four) hours as needed for wheezing or shortness of breath., Disp: 1 each, Rfl: 3   lisinopril -hydrochlorothiazide  (ZESTORETIC ) 20-25 MG tablet, Take 1 tablet by mouth daily., Disp: 90 tablet, Rfl: 0   lovastatin  (MEVACOR ) 20 MG tablet, Take 1 tablet (20 mg total) by mouth daily., Disp: 100 tablet, Rfl: 1   budesonide -formoterol  (SYMBICORT ) 160-4.5 MCG/ACT inhaler, Inhale 2 puffs into the lungs 2 (two) times daily., Disp: 1 each, Rfl: 5  Past Medical History:  Diagnosis Date   COPD (chronic obstructive pulmonary disease) (HCC)    Hypercholesteremia    Hypertension    Objective:  PHYSICAL EXAM:   BP (!) 146/80   Pulse 81   Temp 97.7 F (36.5 C)   Resp 16   Ht 5\' 5"  (  1.651 m)   Wt 139 lb (63 kg)   SpO2 95%   BMI 23.13 kg/m    GEN: Well nourished, well developed, in no acute distress   Cardiac: RRR; no murmurs, rubs, or gallops, mild pedal edema Respiratory:  normal respiratory rate and pattern with no distress - normal breath sounds with no rales, rhonchi, wheezes or rubs  Skin: warm and dry, no rash  Neuro:  Alert and Oriented x 3, - CN II-Xii grossly intact Psych: euthymic mood, appropriate affect and demeanor  EKG normal Assessment & Plan:    Benign hypertension -     EKG 12-Lead -     Lisinopril -hydroCHLOROthiazide ; Take 1 tablet by mouth daily.  Dispense: 90 tablet; Refill: 0  Weight loss  Refuses treatment  COPD exacerbation (HCC) -     Budesonide -Formoterol  Fumarate; Inhale 2 puffs into the lungs 2  (two) times daily.  Dispense: 1 each; Refill: 5     Follow-up: Return in about 4 weeks (around 11/06/2023) for follow-up. And labwork  An After Visit Summary was printed and given to the patient.  Anthonette Bastos [facility] [phone]

## 2023-10-13 ENCOUNTER — Ambulatory Visit: Payer: Self-pay | Admitting: *Deleted

## 2023-10-13 NOTE — Telephone Encounter (Signed)
 Left detail message for patient to call office back

## 2023-10-13 NOTE — Telephone Encounter (Signed)
 Please call pt --- she can go back to her old dose of medication but with how she was feeling especially 'short winded' she needs to go to hospital ED for further evaluation

## 2023-10-13 NOTE — Telephone Encounter (Signed)
  Chief Complaint: low BP readings since starting new medication 10/09/23. Zestoretic  20-25 mg Symptoms: BP now 139/78 pulse 84 rechecked for BP 125/70 pulse 81. Reports going out to eat and started feeling clammy. Was out in heat for short time but reports clammy feeling episodes has been noted since taking Zestoretic . "Short winded" at times not now. Reports BP last night 98/58 and this am 102/69.  Frequency: yesterday  Pertinent Negatives: Patient denies chest pain, no difficulty breathing no fever reported. No c/o dizziness or lightheadedness. No feeling like passing out ,no N/V Disposition: [] ED /[] Urgent Care (no appt availability in office) / [] Appointment(In office/virtual)/ []  [place] [facility]/ [] Home Care/ [x] Refused Recommended Disposition /[] [place]/ []  Follow-up with PCP Additional Notes:   Recommended appt tomorrow. Patient reports she was just seen 10/09/23. Would like for PCP to recommend if patient needs to go back to her "old " medication. Declined appt at this time. Please advise regarding appt.  Recommended if BP <90 systolic call back or if in the 80's call 911.       Copied from CRM [id number]. Topic: Clinical - Red Word Triage >> Oct 13, 2023  3:31 PM Baldomero Bone wrote: Red Word that prompted transfer to Nurse Triage: Blood pressure keeps dropping down with the new lisinopril -hydrochlorothiazide  (ZESTORETIC ) 20-25 MG tablet prescribed. Today's reading 102/69 pulse 80. Last night was 98/58. Callback number is [phone] Reason for Disposition  [1] Systolic BP 90-110 AND [2] taking blood pressure medications AND [3] NOT dizzy, lightheaded or weak  Answer Assessment - Initial Assessment Questions 1. BLOOD PRESSURE: "What is the blood pressure?" "Did you take at least two measurements 5 minutes apart?"     BP 139/78 pulse 84 rechecked for 125/70 pulse 81 2. ONSET: "When did you take your blood pressure?"     Now  3. HOW: "How did you obtain the blood  pressure?" (e.g., visiting nurse, automatic home BP monitor)     Home monitor  4. HISTORY: "Do you have a history of low blood pressure?" "What is your blood pressure normally?"     Yes  5. MEDICINES: "Are you taking any medications for blood pressure?" If Yes, ask: "Have they been changed recently?"     Yes zestoretic   6. PULSE RATE: "Do you know what your pulse rate is?"      84 7. OTHER SYMPTOMS: "Have you been sick recently?" "Have you had a recent injury?"     No  8. PREGNANCY: "Is there any chance you are pregnant?" "When was your last menstrual period?"     na  Protocols used: Blood Pressure - Low-A-AH

## 2023-10-16 ENCOUNTER — Encounter: Payer: Self-pay | Admitting: Physician Assistant

## 2023-10-16 ENCOUNTER — Ambulatory Visit (INDEPENDENT_AMBULATORY_CARE_PROVIDER_SITE_OTHER): Admitting: Physician Assistant

## 2023-10-16 VITALS — BP 130/70 | HR 83 | Temp 98.2°F | Resp 16 | Ht 65.0 in | Wt 141.4 lb

## 2023-10-16 DIAGNOSIS — R5383 Other fatigue: Secondary | ICD-10-CM | POA: Diagnosis not present

## 2023-10-16 DIAGNOSIS — E782 Mixed hyperlipidemia: Secondary | ICD-10-CM | POA: Diagnosis not present

## 2023-10-16 DIAGNOSIS — I10 Essential (primary) hypertension: Secondary | ICD-10-CM | POA: Diagnosis not present

## 2023-10-16 MED ORDER — LISINOPRIL-HYDROCHLOROTHIAZIDE 20-12.5 MG PO TABS: 1.0000 | ORAL_TABLET | Freq: Every day | ORAL | Status: DC

## 2023-10-16 NOTE — Progress Notes (Signed)
 Subjective:  Patient ID: Courtney Levine, female    DOB: 04/15/1951  Age: 73 y.o. MRN: [id number]  Chief Complaint  Patient presents with   Hypertension    Hypertension   Pt in today for blood pressure follow up - she is concerned because her bp at home has continued to fluctuate and now she feels that it has been running high.  She is currently taking zestoretic  20/12.5mg  qd.  She denies chest pain or dyspnea but states last week after being out she had generalized weakness and thought it was due to bp She brings her wrist cuff today and her readings were 150s/upper 80s and our readings with different CMAs taking bp was consistently 130/70 Pt advised perhaps her cuff is not calibrated However with her possible fluctuation of bp, generalized weakness and risk factors for CAD (smoker, elevated lipids) advise cardiology referral and she is finally agreeable Pt denies any symptoms today and says she is feeling well    09/25/2023    8:53 AM 09/02/2023    9:22 AM 02/27/2023    9:25 AM 08/22/2022    8:50 AM 07/17/2022   10:16 AM  Depression screen PHQ 2/9  Decreased Interest 0 0 0 0 0  Down, Depressed, Hopeless 0 0 0 0 0  PHQ - 2 Score 0 0 0 0 0        02/19/2022    9:25 AM 07/17/2022   10:22 AM 02/27/2023    9:25 AM 09/02/2023    9:22 AM 09/25/2023    8:46 AM  Fall Risk  Falls in the past year? 0 0 0 0 0  Was there an injury with Fall? 0 0 0 0 0  Fall Risk Category Calculator 0 0 0 0 0  Fall Risk Category (Retired) Low      (RETIRED) Patient Fall Risk Level Low fall risk      Patient at Risk for Falls Due to No Fall Risks No Fall Risks No Fall Risks No Fall Risks No Fall Risks  Fall risk Follow up Falls evaluation completed Falls evaluation completed;Education provided Falls evaluation completed Falls evaluation completed Falls evaluation completed;Education provided    CONSTITUTIONAL: Negative for chills, fatigue, fever,   CARDIOVASCULAR: Negative for chest pain,  palpitations - does  have mild pedal edema RESPIRATORY: Negative for recent cough and dyspnea.  INTEGUMENTARY: Negative for rash.        Current Outpatient Medications:    albuterol  (VENTOLIN  HFA) 108 (90 Base) MCG/ACT inhaler, Inhale 2 puffs into the lungs every 4 (four) hours as needed for wheezing or shortness of breath., Disp: 1 each, Rfl: 3   budesonide -formoterol  (SYMBICORT ) 160-4.5 MCG/ACT inhaler, Inhale 2 puffs into the lungs 2 (two) times daily., Disp: 1 each, Rfl: 5   lisinopril -hydrochlorothiazide  (ZESTORETIC ) 20-12.5 MG tablet, Take 1 tablet by mouth daily., Disp: , Rfl:    lovastatin  (MEVACOR ) 20 MG tablet, Take 1 tablet (20 mg total) by mouth daily., Disp: 100 tablet, Rfl: 1  Past Medical History:  Diagnosis Date   COPD (chronic obstructive pulmonary disease) (HCC)    Hypercholesteremia    Hypertension    Objective:  PHYSICAL EXAM:   VS: BP 130/70   Pulse 83   Temp 98.2 F (36.8 C) (Temporal)   Resp 16   Ht 5\' 5"  (1.651 m)   Wt 141 lb 6.4 oz (64.1 kg)   SpO2 96%   BMI 23.53 kg/m   GEN: Well nourished, well developed, in no acute distress  Cardiac: RRR; no murmurs, rubs, or gallops,mild edema Respiratory:  scattered rhonchi and wheezes noted (chronic) Skin: warm and dry, no rash  Neuro:  Alert and Oriented x 3, - CN II-Xii grossly intact Psych: euthymic mood, appropriate affect and demeanor  Assessment & Plan:    Other fatigue -     CBC with Differential/Platelet -     Comprehensive metabolic panel with GFR -     TSH -     Ambulatory referral to Cardiology  Primary hypertension -     Ambulatory referral to Cardiology -     Lisinopril -hydroCHLOROthiazide ; Take 1 tablet by mouth daily.  Mixed hyperlipidemia -     Ambulatory referral to Cardiology      Follow-up: Return for as scheduled in June. And labwork  An After Visit Summary was printed and given to the patient.  Anthonette Bastos [facility] [phone]

## 2023-10-17 ENCOUNTER — Ambulatory Visit: Payer: Self-pay | Admitting: Physician Assistant

## 2023-10-17 ENCOUNTER — Telehealth: Payer: Self-pay

## 2023-10-17 DIAGNOSIS — I10 Essential (primary) hypertension: Secondary | ICD-10-CM

## 2023-10-17 DIAGNOSIS — E871 Hypo-osmolality and hyponatremia: Secondary | ICD-10-CM

## 2023-10-17 LAB — CBC WITH DIFFERENTIAL/PLATELET
Basophils Absolute: 0.1 10*3/uL (ref 0.0–0.2)
Basos: 1 %
EOS (ABSOLUTE): 0.1 10*3/uL (ref 0.0–0.4)
Eos: 1 %
Hematocrit: 34.6 % (ref 34.0–46.6)
Hemoglobin: 13 g/dL (ref 11.1–15.9)
Immature Grans (Abs): 0 10*3/uL (ref 0.0–0.1)
Immature Granulocytes: 0 %
Lymphocytes Absolute: 2.8 10*3/uL (ref 0.7–3.1)
Lymphs: 32 %
MCH: 38.7 pg — ABNORMAL HIGH (ref 26.6–33.0)
MCHC: 37.6 g/dL — ABNORMAL HIGH (ref 31.5–35.7)
MCV: 103 fL — ABNORMAL HIGH (ref 79–97)
Monocytes Absolute: 0.7 10*3/uL (ref 0.1–0.9)
Monocytes: 7 %
Neutrophils Absolute: 5.1 10*3/uL (ref 1.4–7.0)
Neutrophils: 59 %
Platelets: 198 10*3/uL (ref 150–450)
RBC: 3.36 x10E6/uL — ABNORMAL LOW (ref 3.77–5.28)
RDW: 13.1 % (ref 11.7–15.4)
WBC: 8.7 10*3/uL (ref 3.4–10.8)

## 2023-10-17 LAB — COMPREHENSIVE METABOLIC PANEL WITH GFR
ALT: 18 IU/L (ref 0–32)
AST: 32 IU/L (ref 0–40)
Albumin: 4.1 g/dL (ref 3.8–4.8)
Alkaline Phosphatase: 86 IU/L (ref 44–121)
BUN/Creatinine Ratio: 12 (ref 12–28)
BUN: 10 mg/dL (ref 8–27)
Bilirubin Total: 0.5 mg/dL (ref 0.0–1.2)
CO2: 22 mmol/L (ref 20–29)
Calcium: 9.6 mg/dL (ref 8.7–10.3)
Chloride: 88 mmol/L — ABNORMAL LOW (ref 96–106)
Creatinine, Ser: 0.85 mg/dL (ref 0.57–1.00)
Globulin, Total: 2.7 g/dL (ref 1.5–4.5)
Glucose: 93 mg/dL (ref 70–99)
Potassium: 4.4 mmol/L (ref 3.5–5.2)
Sodium: 125 mmol/L — ABNORMAL LOW (ref 134–144)
Total Protein: 6.8 g/dL (ref 6.0–8.5)
eGFR: 72 mL/min/{1.73_m2} (ref >59)

## 2023-10-17 LAB — TSH: TSH: 1.64 u[IU]/mL (ref 0.450–4.500)

## 2023-10-17 MED ORDER — LISINOPRIL 20 MG PO TABS
20.0000 mg | ORAL_TABLET | Freq: Every day | ORAL | 1 refills | Status: AC
Start: 2023-10-17 — End: ?

## 2023-10-17 NOTE — Telephone Encounter (Signed)
 Called patient and left a voicemail for patient to call after office number. [phone]

## 2023-10-21 DIAGNOSIS — R030 Elevated blood-pressure reading, without diagnosis of hypertension: Secondary | ICD-10-CM | POA: Diagnosis not present

## 2023-10-22 NOTE — Telephone Encounter (Signed)
 Left message for patient to return call.   Copied from CRM [id number]. Topic: General - Other >> Oct 21, 2023  2:50 PM Essie A wrote: Reason for CRM: Patient wants Cyndi Drain to return her call.  She wants to discuss her blood pressure.  Please call her at [phone].

## 2023-10-22 NOTE — Telephone Encounter (Signed)
Left message for patient to return call. Will try again later

## 2023-10-22 NOTE — Telephone Encounter (Signed)
 Patient was informed. She made an appointment to come tomorrow to recheck Sodium per Dr Reinhold Carbine. Order is in.

## 2023-10-22 NOTE — Addendum Note (Signed)
 Addended by: Jamie Mccoy I on: 10/22/2023 04:54 PM   Modules accepted: Orders

## 2023-10-23 ENCOUNTER — Other Ambulatory Visit

## 2023-10-23 DIAGNOSIS — E871 Hypo-osmolality and hyponatremia: Secondary | ICD-10-CM | POA: Diagnosis not present

## 2023-10-24 ENCOUNTER — Ambulatory Visit: Payer: Self-pay | Admitting: Family Medicine

## 2023-10-24 LAB — COMPREHENSIVE METABOLIC PANEL WITH GFR
ALT: 19 [IU]/L (ref 0–32)
AST: 21 [IU]/L (ref 0–40)
Albumin: 4.2 g/dL (ref 3.8–4.8)
Alkaline Phosphatase: 89 [IU]/L (ref 44–121)
BUN/Creatinine Ratio: 10 — ABNORMAL LOW (ref 12–28)
BUN: 8 mg/dL (ref 8–27)
Bilirubin Total: 0.3 mg/dL (ref 0.0–1.2)
CO2: 20 mmol/L (ref 20–29)
Calcium: 9.9 mg/dL (ref 8.7–10.3)
Chloride: 96 mmol/L (ref 96–106)
Creatinine, Ser: 0.84 mg/dL (ref 0.57–1.00)
Globulin, Total: 2.6 g/dL (ref 1.5–4.5)
Glucose: 103 mg/dL — ABNORMAL HIGH (ref 70–99)
Potassium: 4.2 mmol/L (ref 3.5–5.2)
Sodium: 136 mmol/L (ref 134–144)
Total Protein: 6.8 g/dL (ref 6.0–8.5)
eGFR: 73 mL/min/{1.73_m2}

## 2023-10-30 ENCOUNTER — Ambulatory Visit: Attending: Cardiology | Admitting: Cardiology

## 2023-10-30 ENCOUNTER — Encounter: Payer: Self-pay | Admitting: Cardiology

## 2023-10-30 VITALS — BP 140/90 | HR 95 | Ht 65.0 in | Wt 136.0 lb

## 2023-10-30 DIAGNOSIS — J431 Panlobular emphysema: Secondary | ICD-10-CM

## 2023-10-30 DIAGNOSIS — F17210 Nicotine dependence, cigarettes, uncomplicated: Secondary | ICD-10-CM | POA: Diagnosis not present

## 2023-10-30 DIAGNOSIS — R0609 Other forms of dyspnea: Secondary | ICD-10-CM

## 2023-10-30 DIAGNOSIS — R011 Cardiac murmur, unspecified: Secondary | ICD-10-CM | POA: Diagnosis not present

## 2023-10-30 DIAGNOSIS — I10 Essential (primary) hypertension: Secondary | ICD-10-CM | POA: Diagnosis not present

## 2023-10-30 DIAGNOSIS — Z720 Tobacco use: Secondary | ICD-10-CM

## 2023-10-30 DIAGNOSIS — E782 Mixed hyperlipidemia: Secondary | ICD-10-CM | POA: Diagnosis not present

## 2023-10-30 NOTE — Patient Instructions (Signed)
 Medication Instructions:  Your physician recommends that you continue on your current medications as directed. Please refer to the Current Medication list given to you today.   *If you need a refill on your cardiac medications before your next appointment, please call your pharmacy*   Lab Work: None ordered If you have labs (blood work) drawn today and your tests are completely normal, you will receive your results only by: MyChart Message (if you have MyChart) OR A paper copy in the mail If you have any lab test that is abnormal or we need to change your treatment, we will call you to review the results.   Testing/Procedures:   [facility] at [facility] [address] [place], [place] [address] Phone: [phone]   Please arrive 15 minutes prior to your appointment time for registration and insurance purposes.  The test will take approximately 3 to 4 hours to complete; you may bring reading material.  If someone comes with you to your appointment, they will need to remain in the main lobby due to limited space in the testing area.   How to prepare for your Myocardial Perfusion Test: Do not eat or drink 3 hours prior to your test, except you may have water. Do not consume products containing caffeine (regular or decaffeinated) 12 hours prior to your test. (ex: coffee, chocolate, sodas, tea). Do bring a list of your current medications with you.  If not listed below, you may take your medications as normal. Do wear comfortable clothes (no dresses or overalls) and walking shoes, tennis shoes preferred (No heels or open toe shoes are allowed). Do NOT wear cologne, perfume, aftershave, or lotions (deodorant is allowed). If these instructions are not followed, your test will have to be rescheduled.  If you cannot keep your appointment, please provide 24 hours notification to the Nuclear Lab, to avoid a possible $50 charge to your account.     Your physician has  requested that you have an echocardiogram. Echocardiography is a painless test that uses sound waves to create images of your heart. It provides your doctor with information about the size and shape of your heart and how well your heart's chambers and valves are working. This procedure takes approximately one hour. There are no restrictions for this procedure. Please do NOT wear cologne, perfume, aftershave, or lotions (deodorant is allowed). Please arrive 15 minutes prior to your appointment time.  Please note: We ask at that you not bring children with you during ultrasound (echo/ vascular) testing. Due to room size and safety concerns, children are not allowed in the ultrasound rooms during exams. Our front office staff cannot provide observation of children in our lobby area while testing is being conducted. An adult accompanying a patient to their appointment will only be allowed in the ultrasound room at the discretion of the ultrasound technician under special circumstances. We apologize for any inconvenience.    Follow-Up: At [facility], you and your health needs are our priority.  As part of our continuing mission to provide you with exceptional heart care, we have created designated Provider Care Teams.  These Care Teams include your primary Cardiologist (physician) and Advanced Practice Providers (APPs -  Physician Assistants and Nurse Practitioners) who all work together to provide you with the care you need, when you need it.  We recommend signing up for the patient portal called "MyChart".  Sign up information is provided on this After Visit Summary.  MyChart is used to connect  with patients for Virtual Visits (Telemedicine).  Patients are able to view lab/test results, encounter notes, upcoming appointments, etc.  Non-urgent messages can be sent to your provider as well.   To learn more about what you can do with MyChart, go to [email or web address].    Your next  appointment:   9 month(s)  Provider:   Hillis Lu, MD   Other Instructions  Cardiac Nuclear Scan A cardiac nuclear scan is a test that is done to check the flow of blood to your heart. It is done when you are resting and when you are exercising. The test looks for problems such as: Not enough blood reaching a portion of the heart. The heart muscle not working as it should. You may need this test if you have: Heart disease. Lab results that are not normal. Had heart surgery or a balloon procedure to open up blocked arteries (angioplasty) or a small mesh tube (stent). Chest pain. Shortness of breath. Had a heart attack. In this test, a special dye (tracer) is put into your bloodstream. The tracer will travel to your heart. A camera will then take pictures of your heart to see how the tracer moves through your heart. This test is usually done at a hospital and takes 2-4 hours. Tell a doctor about: Any allergies you have. All medicines you are taking, including vitamins, herbs, eye drops, creams, and over-the-counter medicines. Any bleeding problems you have. Any surgeries you have had. Any medical conditions you have. Whether you are pregnant or may be pregnant. Any history of asthma or long-term (chronic) lung disease. Any history of heart rhythm disorders or heart valve conditions. What are the risks? Your doctor will talk with you about risks. These may include: Serious chest pain and heart attack. This is only a risk if the stress portion of the test is done. Fast or uneven heartbeats (palpitations). A feeling of warmth in your chest. This feeling usually does not last long. Allergic reaction to the tracer. Shortness of breath or trouble breathing. What happens before the test? Ask your doctor about changing or stopping your normal medicines. Follow instructions from your doctor about what you cannot eat or drink. Remove your jewelry on the day of the test. Ask your  doctor if you need to avoid nicotine or caffeine. What happens during the test? An IV tube will be inserted into one of your veins. Your doctor will give you a small amount of tracer through the IV tube. You will wait for 20-40 minutes while the tracer moves through your bloodstream. Your heart will be monitored with an electrocardiogram (ECG). You will lie down on an exam table. Pictures of your heart will be taken for about 15-20 minutes. You may also have a stress test. For this test, one of these things may be done: You will be asked to exercise on a treadmill or a stationary bike. You will be given medicines that will make your heart work harder. This is done if you are unable to exercise. When blood flow to your heart has peaked, a tracer will again be given through the IV tube. After 20-40 minutes, you will get back on the exam table. More pictures will be taken of your heart. Depending on the tracer that is used, more pictures may need to be taken 3-4 hours later. Your IV tube will be removed when the test is over. The test may vary among doctors and hospitals. What happens after the test? Ask your doctor: Whether  you can return to your normal schedule, including diet, activities, travel, and medicines. Whether you should drink more fluids. This will help to remove the tracer from your body. Ask your doctor, or the department that is doing the test: When will my results be ready? How will I get my results? What are my treatment options? What other tests do I need? What are my next steps? This information is not intended to replace advice given to you by your health care provider. Make sure you discuss any questions you have with your health care provider. Document Revised: 10/09/2021 Document Reviewed: 10/09/2021 Elsevier Patient Education  2023 Else[employer] Inc.  Echocardiogram An echocardiogram is a test that uses sound waves (ultrasound) to produce images of the heart. Images  from an echocardiogram can provide important information about: Heart size and shape. The size and thickness and movement of your heart's walls. Heart muscle function and strength. Heart valve function or if you have stenosis. Stenosis is when the heart valves are too narrow. If blood is flowing backward through the heart valves (regurgitation). A tumor or infectious growth around the heart valves. Areas of heart muscle that are not working well because of poor blood flow or injury from a heart attack. Aneurysm detection. An aneurysm is a weak or damaged part of an artery wall. The wall bulges out from the normal force of blood pumping through the body. Tell a health care provider about: Any allergies you have. All medicines you are taking, including vitamins, herbs, eye drops, creams, and over-the-counter medicines. Any blood disorders you have. Any surgeries you have had. Any medical conditions you have. Whether you are pregnant or may be pregnant. What are the risks? Generally, this is a safe test. However, problems may occur, including an allergic reaction to dye (contrast) that may be used during the test. What happens before the test? No specific preparation is needed. You may eat and drink normally. What happens during the test?  You will take off your clothes from the waist up and put on a hospital gown. Electrodes or electrocardiogram (ECG)patches may be placed on your chest. The electrodes or patches are then connected to a device that monitors your heart rate and rhythm. You will lie down on a table for an ultrasound exam. A gel will be applied to your chest to help sound waves pass through your skin. A handheld device, called a transducer, will be pressed against your chest and moved over your heart. The transducer produces sound waves that travel to your heart and bounce back (or "echo" back) to the transducer. These sound waves will be captured in real-time and changed into  images of your heart that can be viewed on a video monitor. The images will be recorded on a computer and reviewed by your health care provider. You may be asked to change positions or hold your breath for a short time. This makes it easier to get different views or better views of your heart. In some cases, you may receive contrast through an IV in one of your veins. This can improve the quality of the pictures from your heart. The procedure may vary among health care providers and hospitals. What can I expect after the test? You may return to your normal, everyday life, including diet, activities, and medicines, unless your health care provider tells you not to do that. Follow these instructions at home: It is up to you to get the results of your test. Ask your health care  provider, or the department that is doing the test, when your results will be ready. Keep all follow-up visits. This is important. Summary An echocardiogram is a test that uses sound waves (ultrasound) to produce images of the heart. Images from an echocardiogram can provide important information about the size and shape of your heart, heart muscle function, heart valve function, and other possible heart problems. You do not need to do anything to prepare before this test. You may eat and drink normally. After the echocardiogram is completed, you may return to your normal, everyday life, unless your health care provider tells you not to do that. This information is not intended to replace advice given to you by your health care provider. Make sure you discuss any questions you have with your health care provider. Document Revised: 01/24/2021 Document Reviewed: 01/04/2020 Elsevier Patient Education  2023 Elsevier Inc.    Important Information About Sugar

## 2023-10-30 NOTE — Progress Notes (Signed)
 Cardiology Office Note:    Date:  10/30/2023   ID:  Courtney Levine, DOB [birth year], MRN [id number]  PCP:  Cyndi Drain, PA-C  Cardiologist:  Nelia Balzarine, MD   Referring MD: Cyndi Drain, PA-C    ASSESSMENT:    1. Benign hypertension   2. Mixed hyperlipidemia   3. Dyspnea on exertion   4. Panlobular emphysema (HCC)   5. Tobacco abuse    PLAN:    In order of problems listed above:  Primary prevention stressed with the patient.  Importance of compliance with diet medication stressed and patient verbalized standing. Dyspnea on exertion: She has multiple risk factors for coronary artery disease so we will do a Lexiscan sestamibi to rule out any ischemic substrate Cardiac murmur: Echocardiogram will be done to assess murmur heard on auscultation. COPD: Followed by primary care. Cigarette smoker: I spent 5 minutes with the patient discussing solely about smoking. Smoking cessation was counseled. I suggested to the patient also different medications and pharmacological interventions. Patient is keen to try stopping on its own at this time. He will get back to me if he needs any further assistance in this matter. Essential hypertension: Blood pressure is mildly elevated.  She will keep a track of her blood pressures at home.  She has brought in a blood pressure machine and I told her to get it to any provider's office to make sure it is doing a right check.  She is agreeable.  Diet emphasized.  Lifestyle modification urged.  Salt intake issues discussed and she promises to do better. Patient will be seen in follow-up appointment in 6 months or earlier if the patient has any concerns.    Medication Adjustments/Labs and Tests Ordered: Current medicines are reviewed at length with the patient today.  Concerns regarding medicines are outlined above.  Orders Placed This Encounter  Procedures   EKG 12-Lead   No orders of the defined types were placed in this encounter.    History of  Present Illness:    Courtney Levine is a 72 y.o. female who is being seen today for the evaluation of dyspnea on exertion and elevated blood pressure at the request of Cyndi Drain, PA-C.  Patient is a pleasant 73 year old female.  She has past medical history of essential hypertension dyslipidemia COPD and unfortunately continues to smoke at least half a pack on a daily basis.  She denies any chest pain orthopnea or PND.  She leads a sedentary lifestyle.  She gives history of dyspnea on exertion.  At the time of my evaluation, the patient is alert awake oriented and in no distress.  Past Medical History:  Diagnosis Date   COPD (chronic obstructive pulmonary disease) (HCC)    Hypercholesteremia    Hypertension     Past Surgical History:  Procedure Laterality Date   CATARACT EXTRACTION     2020    Current Medications: Current Meds  Medication Sig   albuterol  (VENTOLIN  HFA) 108 (90 Base) MCG/ACT inhaler Inhale 2 puffs into the lungs every 4 (four) hours as needed for wheezing or shortness of breath.   budesonide -formoterol  (SYMBICORT ) 160-4.5 MCG/ACT inhaler Inhale 2 puffs into the lungs 2 (two) times daily.   lisinopril  (ZESTRIL ) 20 MG tablet Take 1 tablet (20 mg total) by mouth daily.   lovastatin  (MEVACOR ) 20 MG tablet Take 1 tablet (20 mg total) by mouth daily.     Allergies:   Patient has no known allergies.   Social History  Socioeconomic History   Marital status: Single    Spouse name: Not on file   Number of children: 1   Years of education: Not on file   Highest education level: 10th grade  Occupational History   Occupation: [employer]   part time  Tobacco Use   Smoking status: Every Day    Current packs/day: 0.50    Types: Cigarettes   Smokeless tobacco: Never  Vaping Use   Vaping status: Never Used  Substance and Sexual Activity   Alcohol use: Yes    Alcohol/week: 14.0 standard drinks of alcohol    Types: 14 Cans of beer per week    Comment: 1-2  beers daily   Drug use: No   Sexual activity: Not Currently  Other Topics Concern   Not on file  Social History Narrative   Not on file   Social Drivers of Health   Financial Resource Strain: Low Risk  (09/25/2023)   Overall Financial Resource Strain (CARDIA)    Difficulty of Paying Living Expenses: Not hard at all  Food Insecurity: No Food Insecurity (09/25/2023)   Hunger Vital Sign    Worried About Running Out of Food in the Last Year: Never true    Ran Out of Food in the Last Year: Never true  Transportation Needs: No Transportation Needs (09/25/2023)   PRAPARE - Administrator, Civil Service (Medical): No    Lack of Transportation (Non-Medical): No  Physical Activity: Insufficiently Active (09/25/2023)   Exercise Vital Sign    Days of Exercise per Week: 4 days    Minutes of Exercise per Session: 20 min  Stress: No Stress Concern Present (09/25/2023)   [employer] of Occupational Health - Occupational Stress Questionnaire    Feeling of Stress : Not at all  Social Connections: Unknown (09/25/2023)   Social Connection and Isolation Panel [NHANES]    Frequency of Communication with Friends and Family: More than three times a week    Frequency of Social Gatherings with Friends and Family: Three times a week    Attends Religious Services: Patient declined    Active Member of Clubs or Organizations: No    Attends Banker Meetings: Never    Marital Status: Patient declined     Family History: The patient's family history includes Dementia in her sister; Diabetes in her brother; Pulmonary embolism in her mother.  ROS:   Please see the history of present illness.    All other systems reviewed and are negative.  EKGs/Labs/Other Studies Reviewed:    The following studies were reviewed today:  EKG Interpretation Date/Time:  Thursday October 30 2023 08:42:25 EDT Ventricular Rate:  95 PR Interval:  172 QRS Duration:  62 QT Interval:  348 QTC  Calculation: 437 R Axis:   54  Text Interpretation: Normal sinus rhythm Normal ECG When compared with ECG of 04-Feb-2017 20:40, No significant change was found Confirmed by Hillis Lu [phone]) on 10/30/2023 9:02:37 AM     Recent Labs: 10/16/2023: Hemoglobin 13.0; Platelets 198; TSH 1.640 10/23/2023: ALT 19; BUN 8; Creatinine, Ser 0.84; Potassium 4.2; Sodium 136  Recent Lipid Panel    Component Value Date/Time   CHOL 171 09/02/2023 1006   TRIG 88 09/02/2023 1006   HDL 54 09/02/2023 1006   CHOLHDL 3.2 09/02/2023 1006   LDLCALC 101 (H) 09/02/2023 1006    Physical Exam:    VS:  BP (!) 140/90   Pulse 95   Ht 5\' 5"  (1.651  m)   Wt 136 lb (61.7 kg)   SpO2 96%   BMI 22.63 kg/m     Wt Readings from Last 3 Encounters:  10/30/23 136 lb (61.7 kg)  10/16/23 141 lb 6.4 oz (64.1 kg)  10/09/23 139 lb (63 kg)     GEN: Patient is in no acute distress HEENT: Normal NECK: No JVD; No carotid bruits LYMPHATICS: No lymphadenopathy CARDIAC: S1 S2 regular, 2/6 systolic murmur at the apex. RESPIRATORY:  Clear to auscultation without rales, wheezing or rhonchi  ABDOMEN: Soft, non-tender, non-distended MUSCULOSKELETAL:  No edema; No deformity  SKIN: Warm and dry NEUROLOGIC:  Alert and oriented x 3 PSYCHIATRIC:  Normal affect    Signed, Nelia Balzarine, MD  10/30/2023 9:15 AM    [facility]

## 2023-11-11 ENCOUNTER — Encounter: Payer: Self-pay | Admitting: Physician Assistant

## 2023-11-11 ENCOUNTER — Ambulatory Visit (INDEPENDENT_AMBULATORY_CARE_PROVIDER_SITE_OTHER): Admitting: Physician Assistant

## 2023-11-11 VITALS — BP 110/70 | HR 92 | Temp 97.9°F | Resp 18 | Ht 65.0 in | Wt 136.4 lb

## 2023-11-11 DIAGNOSIS — I10 Essential (primary) hypertension: Secondary | ICD-10-CM

## 2023-11-11 DIAGNOSIS — Z5320 Procedure and treatment not carried out because of patient's decision for unspecified reasons: Secondary | ICD-10-CM | POA: Diagnosis not present

## 2023-11-11 DIAGNOSIS — J438 Other emphysema: Secondary | ICD-10-CM | POA: Diagnosis not present

## 2023-11-11 MED ORDER — LISINOPRIL 20 MG PO TABS
20.0000 mg | ORAL_TABLET | Freq: Every day | ORAL | 5 refills | Status: DC
Start: 2023-11-11 — End: 2024-02-05

## 2023-11-11 NOTE — Progress Notes (Signed)
 Subjective:  Patient ID: Courtney Levine, female    DOB: Jul 16, 1950  Age: 73 y.o. MRN: [id number]  Chief Complaint  Patient presents with   Medical Management of Chronic Issues    HPI Pt in today for follow up of hypertension and dyspnea on exertion Pt has known emphysema and continues to smoke.  She is not interested in smoking cessation at this time. She is using ventolin  and symbicort  as directed  Pt brings bp log today and bp has been running much better.  She has bought a new bp cuff She is currently taking zestril  20mg  qd Denies chest pain or dyspnea today  Pt did see cardiology (Dr Lafayette Pierre) and was scheduled for echo and lexiscan.  Pt states she has cancelled those appts and is not going back to cardiology.  She states she was  uncomfortable at her visit and that she 'feels fine and her heart is fine' - I offered to schedule the tests as outpatient or with another cardiology office and pt refuses     09/25/2023    8:53 AM 09/02/2023    9:22 AM 02/27/2023    9:25 AM 08/22/2022    8:50 AM 07/17/2022   10:16 AM  Depression screen PHQ 2/9  Decreased Interest 0 0 0 0 0  Down, Depressed, Hopeless 0 0 0 0 0  PHQ - 2 Score 0 0 0 0 0        02/19/2022    9:25 AM 07/17/2022   10:22 AM 02/27/2023    9:25 AM 09/02/2023    9:22 AM 09/25/2023    8:46 AM  Fall Risk  Falls in the past year? 0 0 0 0 0  Was there an injury with Fall? 0 0 0 0 0  Fall Risk Category Calculator 0 0 0 0 0  Fall Risk Category (Retired) Low       (RETIRED) Patient Fall Risk Level Low fall risk       Patient at Risk for Falls Due to No Fall Risks No Fall Risks No Fall Risks No Fall Risks No Fall Risks  Fall risk Follow up Falls evaluation completed  Falls evaluation completed;Education provided Falls evaluation completed Falls evaluation completed Falls evaluation completed;Education provided     Data saved with a previous flowsheet row definition     ROS CONSTITUTIONAL: Negative for chills, fatigue, fever,   CARDIOVASCULAR: Negative for chest pain, dizziness, palpitations and pedal edema.  RESPIRATORY: Negative for recent cough and dyspnea.  GASTROINTESTINAL: Negative for abdominal pain, acid reflux symptoms, constipation, diarrhea, nausea and vomiting.     Current Outpatient Medications:    albuterol  (VENTOLIN  HFA) 108 (90 Base) MCG/ACT inhaler, Inhale 2 puffs into the lungs every 4 (four) hours as needed for wheezing or shortness of breath., Disp: 1 each, Rfl: 3   budesonide -formoterol  (SYMBICORT ) 160-4.5 MCG/ACT inhaler, Inhale 2 puffs into the lungs 2 (two) times daily., Disp: 1 each, Rfl: 5   lovastatin  (MEVACOR ) 20 MG tablet, Take 1 tablet (20 mg total) by mouth daily., Disp: 100 tablet, Rfl: 1   lisinopril  (ZESTRIL ) 20 MG tablet, Take 1 tablet (20 mg total) by mouth daily., Disp: 30 tablet, Rfl: 5  Past Medical History:  Diagnosis Date   COPD (chronic obstructive pulmonary disease) (HCC)    Hypercholesteremia    Hypertension    Objective:  PHYSICAL EXAM:   BP 110/70   Pulse 92   Temp 97.9 F (36.6 C) (Temporal)   Resp 18   Ht  5' 5 (1.651 m)   Wt 136 lb 6.4 oz (61.9 kg)   SpO2 95%   BMI 22.70 kg/m    GEN: Well nourished, well developed, in no acute distress  Cardiac: RRR; slight murmur noted, rubs, or gallops,no edema - Respiratory:  scattered rhonchi noted - clears with cough GI: normal bowel sounds, no masses or tenderness  Psych: euthymic mood, appropriate affect and demeanor  Assessment & Plan:    Other emphysema (HCC) Continue inhalers as directed Primary hypertension -     Lisinopril ; Take 1 tablet (20 mg total) by mouth daily.  Dispense: 30 tablet; Refill: 5  Refuses treatment Pt refuses cardiology follow up and treatment Pt refuses lung CT screening    Follow-up: Return for as directed for chronic visit.  An After Visit Summary was printed and given to the patient.  Anthonette Bastos [facility] [phone]

## 2023-11-12 NOTE — Telephone Encounter (Signed)
 Copied from CRM [id number]. Topic: Clinical - Medication Refill >> Nov 12, 2023  1:35 PM Tiffini S wrote: Medication: lisinopril  (ZESTRIL ) 20 MG tablet   Has the patient contacted their pharmacy? No (Agent: If no, request that the patient contact the pharmacy for the refill. If patient does not wish to contact the pharmacy document the reason why and proceed with request.) (Agent: If yes, when and what did the pharmacy advise?)  This is the patient's preferred pharmacy:  [id number], [place] - [address] [place] [address] Phone: [phone] Fax: [phone]   Is this the correct pharmacy for this prescription? Yes If no, delete pharmacy and type the correct one.   Has the prescription been filled recently? Yes  Is the patient out of the medication? Yes  Has the patient been seen for an appointment in the last year OR does the patient have an upcoming appointment? Yes  Can we respond through MyChart? No  Agent: Please be advised that Rx refills may take up to 3 business days. We ask that you follow-up with your pharmacy.

## 2023-11-18 ENCOUNTER — Encounter

## 2023-11-27 ENCOUNTER — Other Ambulatory Visit

## 2023-12-19 ENCOUNTER — Other Ambulatory Visit: Payer: Self-pay | Admitting: Physician Assistant

## 2023-12-19 DIAGNOSIS — E782 Mixed hyperlipidemia: Secondary | ICD-10-CM

## 2024-01-02 ENCOUNTER — Other Ambulatory Visit: Payer: Self-pay | Admitting: Physician Assistant

## 2024-01-02 DIAGNOSIS — J441 Chronic obstructive pulmonary disease with (acute) exacerbation: Secondary | ICD-10-CM

## 2024-02-25 DEATH — deceased

## 2024-03-04 ENCOUNTER — Ambulatory Visit: Admitting: Physician Assistant
